# Patient Record
Sex: Male | Born: 1935 | Race: White | Hispanic: No | Marital: Married | Smoking: Never smoker
Health system: Southern US, Community
[De-identification: ages and names within clinical notes are randomized; demographics above are authoritative.]

## PROBLEM LIST (undated history)

## (undated) DIAGNOSIS — E039 Hypothyroidism, unspecified: Secondary | ICD-10-CM

## (undated) DIAGNOSIS — E119 Type 2 diabetes mellitus without complications: Secondary | ICD-10-CM

## (undated) DIAGNOSIS — I951 Orthostatic hypotension: Secondary | ICD-10-CM

## (undated) DIAGNOSIS — I480 Paroxysmal atrial fibrillation: Secondary | ICD-10-CM

## (undated) DIAGNOSIS — I639 Cerebral infarction, unspecified: Secondary | ICD-10-CM

## (undated) DIAGNOSIS — G20 Parkinson's disease: Secondary | ICD-10-CM

## (undated) HISTORY — PX: INGUINAL HERNIA REPAIR: SUR1180

## (undated) HISTORY — PX: COLONOSCOPY: SHX174

---

## 2016-12-18 ENCOUNTER — Emergency Department (HOSPITAL_COMMUNITY): Payer: Medicare Other

## 2016-12-18 ENCOUNTER — Inpatient Hospital Stay (HOSPITAL_COMMUNITY)
Admission: EM | Admit: 2016-12-18 | Discharge: 2016-12-24 | DRG: 064 | Disposition: A | Discharge status: Skilled Nursing Facility | Payer: Medicare Other | Attending: Internal Medicine | Admitting: Internal Medicine

## 2016-12-18 DIAGNOSIS — R402212 Coma scale, best verbal response, none, at arrival to emergency department: Secondary | ICD-10-CM | POA: Diagnosis present

## 2016-12-18 DIAGNOSIS — Z515 Encounter for palliative care: Secondary | ICD-10-CM

## 2016-12-18 DIAGNOSIS — R402112 Coma scale, eyes open, never, at arrival to emergency department: Secondary | ICD-10-CM | POA: Diagnosis present

## 2016-12-18 DIAGNOSIS — R4182 Altered mental status, unspecified: Secondary | ICD-10-CM | POA: Diagnosis not present

## 2016-12-18 DIAGNOSIS — E119 Type 2 diabetes mellitus without complications: Secondary | ICD-10-CM

## 2016-12-18 DIAGNOSIS — Z833 Family history of diabetes mellitus: Secondary | ICD-10-CM

## 2016-12-18 DIAGNOSIS — E876 Hypokalemia: Secondary | ICD-10-CM | POA: Diagnosis not present

## 2016-12-18 DIAGNOSIS — G20 Parkinson's disease: Secondary | ICD-10-CM | POA: Diagnosis present

## 2016-12-18 DIAGNOSIS — Z8673 Personal history of transient ischemic attack (TIA), and cerebral infarction without residual deficits: Secondary | ICD-10-CM

## 2016-12-18 DIAGNOSIS — I480 Paroxysmal atrial fibrillation: Secondary | ICD-10-CM | POA: Diagnosis present

## 2016-12-18 DIAGNOSIS — R54 Age-related physical debility: Secondary | ICD-10-CM | POA: Diagnosis present

## 2016-12-18 DIAGNOSIS — N183 Chronic kidney disease, stage 3 (moderate): Secondary | ICD-10-CM | POA: Diagnosis present

## 2016-12-18 DIAGNOSIS — I6340 Cerebral infarction due to embolism of unspecified cerebral artery: Secondary | ICD-10-CM | POA: Diagnosis not present

## 2016-12-18 DIAGNOSIS — I6322 Cerebral infarction due to unspecified occlusion or stenosis of basilar arteries: Secondary | ICD-10-CM

## 2016-12-18 DIAGNOSIS — E1122 Type 2 diabetes mellitus with diabetic chronic kidney disease: Secondary | ICD-10-CM | POA: Diagnosis present

## 2016-12-18 DIAGNOSIS — I639 Cerebral infarction, unspecified: Secondary | ICD-10-CM

## 2016-12-18 DIAGNOSIS — L8990 Pressure ulcer of unspecified site, unspecified stage: Secondary | ICD-10-CM | POA: Insufficient documentation

## 2016-12-18 DIAGNOSIS — Z66 Do not resuscitate: Secondary | ICD-10-CM | POA: Diagnosis present

## 2016-12-18 DIAGNOSIS — E039 Hypothyroidism, unspecified: Secondary | ICD-10-CM | POA: Diagnosis present

## 2016-12-18 DIAGNOSIS — I130 Hypertensive heart and chronic kidney disease with heart failure and stage 1 through stage 4 chronic kidney disease, or unspecified chronic kidney disease: Secondary | ICD-10-CM | POA: Diagnosis present

## 2016-12-18 DIAGNOSIS — I63219 Cerebral infarction due to unspecified occlusion or stenosis of unspecified vertebral arteries: Secondary | ICD-10-CM | POA: Diagnosis present

## 2016-12-18 DIAGNOSIS — R402342 Coma scale, best motor response, flexion withdrawal, at arrival to emergency department: Secondary | ICD-10-CM | POA: Diagnosis present

## 2016-12-18 DIAGNOSIS — Z7901 Long term (current) use of anticoagulants: Secondary | ICD-10-CM

## 2016-12-18 DIAGNOSIS — Z79899 Other long term (current) drug therapy: Secondary | ICD-10-CM

## 2016-12-18 DIAGNOSIS — I5032 Chronic diastolic (congestive) heart failure: Secondary | ICD-10-CM | POA: Diagnosis present

## 2016-12-18 DIAGNOSIS — F0280 Dementia in other diseases classified elsewhere without behavioral disturbance: Secondary | ICD-10-CM | POA: Diagnosis present

## 2016-12-18 DIAGNOSIS — R29713 NIHSS score 13: Secondary | ICD-10-CM | POA: Diagnosis present

## 2016-12-18 DIAGNOSIS — I6381 Other cerebral infarction due to occlusion or stenosis of small artery: Secondary | ICD-10-CM | POA: Diagnosis present

## 2016-12-18 HISTORY — DX: Type 2 diabetes mellitus without complications: E11.9

## 2016-12-18 HISTORY — DX: Hypothyroidism, unspecified: E03.9

## 2016-12-18 HISTORY — DX: Paroxysmal atrial fibrillation: I48.0

## 2016-12-18 HISTORY — DX: Orthostatic hypotension: I95.1

## 2016-12-18 HISTORY — DX: Cerebral infarction, unspecified: I63.9

## 2016-12-18 HISTORY — DX: Parkinson's disease: G20

## 2016-12-18 LAB — I-STAT TROPONIN, ED: Troponin i, poc: 0.01 ng/mL (ref 0.00–0.08)

## 2016-12-18 LAB — COMPREHENSIVE METABOLIC PANEL
ALBUMIN: 3.7 g/dL (ref 3.5–5.0)
ALT: 24 U/L (ref 17–63)
ANION GAP: 9 (ref 5–15)
AST: 20 U/L (ref 15–41)
Alkaline Phosphatase: 83 U/L (ref 38–126)
BILIRUBIN TOTAL: 0.7 mg/dL (ref 0.3–1.2)
BUN: 26 mg/dL — AB (ref 6–20)
CHLORIDE: 106 mmol/L (ref 101–111)
CO2: 28 mmol/L (ref 22–32)
Calcium: 9.1 mg/dL (ref 8.9–10.3)
Creatinine, Ser: 1.44 mg/dL — ABNORMAL HIGH (ref 0.61–1.24)
GFR calc Af Amer: 52 mL/min — ABNORMAL LOW (ref >60)
GFR calc non Af Amer: 45 mL/min — ABNORMAL LOW (ref >60)
GLUCOSE: 122 mg/dL — AB (ref 65–99)
POTASSIUM: 4 mmol/L (ref 3.5–5.1)
SODIUM: 143 mmol/L (ref 135–145)
TOTAL PROTEIN: 7.2 g/dL (ref 6.5–8.1)

## 2016-12-18 LAB — ETHANOL: Alcohol, Ethyl (B): <10 mg/dL (ref <10)

## 2016-12-18 LAB — CBC
HEMATOCRIT: 42.1 % (ref 39.0–52.0)
Hemoglobin: 14.6 g/dL (ref 13.0–17.0)
MCH: 35 pg — ABNORMAL HIGH (ref 26.0–34.0)
MCHC: 34.7 g/dL (ref 30.0–36.0)
MCV: 101 fL — AB (ref 78.0–100.0)
PLATELETS: 175 10*3/uL (ref 150–400)
RBC: 4.17 MIL/uL — AB (ref 4.22–5.81)
RDW: 13.1 % (ref 11.5–15.5)
WBC: 9.1 10*3/uL (ref 4.0–10.5)

## 2016-12-18 LAB — DIFFERENTIAL
BASOS ABS: 0 10*3/uL (ref 0.0–0.1)
Basophils Relative: 0 %
Eosinophils Absolute: 0 10*3/uL (ref 0.0–0.7)
Eosinophils Relative: 0 %
LYMPHS PCT: 25 %
Lymphs Abs: 2.2 10*3/uL (ref 0.7–4.0)
MONO ABS: 0.6 10*3/uL (ref 0.1–1.0)
Monocytes Relative: 7 %
NEUTROS ABS: 6.2 10*3/uL (ref 1.7–7.7)
Neutrophils Relative %: 68 %

## 2016-12-18 LAB — PROTIME-INR
INR: 2.17
PROTHROMBIN TIME: 24 s — AB (ref 11.4–15.2)

## 2016-12-18 LAB — APTT: APTT: 32 s (ref 24–36)

## 2016-12-18 MED ORDER — SODIUM CHLORIDE 0.9 % IV BOLUS (SEPSIS)
500.0000 mL | Freq: Once | INTRAVENOUS | Status: AC
  Administered 2016-12-18: 500 mL via INTRAVENOUS

## 2016-12-18 NOTE — ED Notes (Signed)
Patient transported to MRI 

## 2016-12-18 NOTE — ED Notes (Signed)
Attempted to straight stick patient for second set of blood cultures but was unsuccessful.

## 2016-12-18 NOTE — ED Notes (Signed)
Lab stated needed to draw another CMP. Notified RN.

## 2016-12-18 NOTE — ED Notes (Signed)
Pt to CT

## 2016-12-18 NOTE — ED Triage Notes (Signed)
Pt here via EMS with c/o AMS that began this morning at 630. Pt responds to touch and pain. Per family pt's mentation decreased across the day and is now not responsive.  Pt has previous stroke, and is confused at baseline, but is able to speak. Pt is currently nonverbal. Pt's CBG was 139 by EMS. Pt has hx of Parkinson's and dementia.

## 2016-12-18 NOTE — ED Notes (Signed)
Pt. Still at MRI. 

## 2016-12-18 NOTE — ED Provider Notes (Signed)
[facility]-EMERGENCY DEPT Provider Note   CSN: [id number] Arrival date & time: 12/18/16  1955     History   Chief Complaint No chief complaint on file.   HPI Andrew Ochoa is a 81 y.o. male presenting with AMS.   Level V caveat as pt is unresponsive.   Pt presenting with h/o parkinsons with dementia, CVA and TIA. Family states that beginning around 6 this morning, pt started to become more confused. He has been progressing until he was unresponsive. Per EMS, family states baseline pt will respond to basic questions. No known complaints prior to confusion.   Per chart review, pt has h/o CVA, unknown year. Per EMS, he ha sno residual neuro defects. He is on warfarin, no known falls. Neurologist at [facility].   HPI  No past medical history on file.  There are no active problems to display for this patient.   No past surgical history on file.     Home Medications    Prior to Admission medications   Medication Sig Start Date End Date Taking? Authorizing Provider  amiodarone (PACERONE) 100 MG tablet Take 100 mg by mouth daily. 07/21/16  Yes [provider]  carbidopa-levodopa (SINEMET IR) 25-100 MG tablet Take 1 tablet by mouth 3 (three) times daily.   Yes [provider]  Cholecalciferol (VITAMIN D3) 1000 units CAPS Take 1,000 Units by mouth daily.   Yes [provider]  Cinnamon 500 MG capsule Take 500 mg by mouth daily.   Yes [provider]  docusate sodium (COLACE) 100 MG capsule Take 100 mg by mouth daily.   Yes [provider]  ENSURE PLUS (ENSURE PLUS) LIQD Take 237 mLs by mouth 3 (three) times daily between meals.   Yes [provider]  fludrocortisone (FLORINEF) 0.1 MG tablet Take 0.1 mg by mouth daily.   Yes [provider]  levothyroxine (SYNTHROID, LEVOTHROID) 50 MCG tablet Take 50 mcg by mouth every morning. 12/11/16  Yes [provider]  lovastatin (MEVACOR) 20 MG tablet Take 20 mg by mouth  every evening. 06/21/16  Yes [provider]  magnesium oxide (MAG-OX) 400 MG tablet Take 400 mg by mouth daily.   Yes [provider]  Multiple Vitamin (MULTIVITAMIN) capsule Take 1 capsule by mouth every morning.   Yes [provider]  pantoprazole (PROTONIX) 40 MG tablet Take 40 mg by mouth daily. 07/04/16  Yes [provider]  risperiDONE (RISPERDAL) 0.5 MG tablet Take 0.5 mg by mouth daily. 11/24/16  Yes [provider]  warfarin (COUMADIN) 2 MG tablet Take 2 mg by mouth daily. 12/05/16  Yes [provider]    Family History No family history on file.  Social History Social History  Substance Use Topics  . Smoking status: Not on file  . Smokeless tobacco: Not on file  . Alcohol use Not on file     Allergies   Neomycin-bacitracin zn-polymyx and Benzalkonium chloride   Review of Systems Review of Systems  Unable to perform ROS: Mental status change     Physical Exam Updated Vital Signs BP 116/81 (BP Location: Right Arm)   Pulse 88   Temp 98.2 F (36.8 C) (Rectal)   Resp 16   SpO2 99%   Physical Exam  Constitutional:  Elderly male responds to painful stimulus. No response to sound. Will not open his eyes with painful stimulus.  HENT:  Head: Normocephalic and atraumatic.  Drooling, but maintaining airway without difficulty.   Eyes:  Pinpoint pupils.  Equal bilaterally.  Cardiovascular: Normal rate, regular rhythm and intact distal pulses.   Pulmonary/Chest: Effort normal. No respiratory distress. He has decreased breath sounds in the right lower field.  Abdominal: Soft. Bowel sounds are normal. There is no tenderness.  Musculoskeletal: He exhibits no edema.  No purposeful movement of any extremity.  Neurological: GCS eye subscore is 1. GCS verbal subscore is 1. GCS motor subscore is 4.  Skin: Skin is warm and dry.  Nursing note and vitals reviewed.   ED Treatments / Results  Labs (all labs ordered are listed,  but only abnormal results are displayed) Labs Reviewed  PROTIME-INR - Abnormal; Notable for the following:       Result Value   Prothrombin Time 24.0 (*)    All other components within normal limits  CBC - Abnormal; Notable for the following:    RBC 4.17 (*)    MCV 101.0 (*)    MCH 35.0 (*)    All other components within normal limits  COMPREHENSIVE METABOLIC PANEL - Abnormal; Notable for the following:    Glucose, Bld 122 (*)    BUN 26 (*)    Creatinine, Ser 1.44 (*)    GFR calc non Af Amer 45 (*)    GFR calc Af Amer 52 (*)    All other components within normal limits  I-STAT CHEM 8, ED - Abnormal; Notable for the following:    Potassium 6.3 (*)    BUN 38 (*)    Creatinine, Ser 1.50 (*)    Glucose, Bld 137 (*)    All other components within normal limits  ETHANOL  APTT  DIFFERENTIAL  RAPID URINE DRUG SCREEN, HOSP PERFORMED  URINALYSIS, ROUTINE W REFLEX MICROSCOPIC  COMPREHENSIVE METABOLIC PANEL  I-STAT TROPONIN, ED    EKG  EKG Interpretation  Date/Time:  Monday December 18 2016 20:14:59 EDT Ventricular Rate:  72 PR Interval:    QRS Duration: 104 QT Interval:  423 QTC Calculation: 463 R Axis:   -36 Text Interpretation:  Sinus rhythm Left axis deviation No old tracing to compare Confirmed by Dione Booze ([id number]) on 12/18/2016 8:28:14 PM       Radiology Dg Chest 2 View  Result Date: 12/18/2016 CLINICAL DATA:  Altered mental status. Nonresponsive. Abnormal lung sounds on exam. EXAM: CHEST  2 VIEW COMPARISON:  01/09/2016 FINDINGS: The heart size and mediastinal contours are within normal limits. Aortic atherosclerosis. Low lung volumes noted. Both lungs are clear. IMPRESSION: Low lung volumes.  No active disease. Electronically Signed   By: Myles Rosenthal M.D.   On: 12/18/2016 21:11   Ct Head Wo Contrast  Result Date: 12/18/2016 CLINICAL DATA:  Altered mental status EXAM: CT HEAD WITHOUT CONTRAST TECHNIQUE: Contiguous axial images were obtained from the base of the skull  through the vertex without intravenous contrast. COMPARISON:  06/01/2015 FINDINGS: Brain: Motion degraded study. No gross hemorrhage or mass is visualized. Hypodensity within the left thalamus, new since comparison CT from 2017. Additional small hypodensity in the right thalamus. Atrophy with small vessel ischemic changes of the white matter. Vascular: No hyperdense vessels.  Carotid artery calcification. Skull: No fracture or suspicious lesion. Fluid within the right mastoid air cells. Sinuses/Orbits: Mucosal thickening within the frontal and ethmoid sinuses. No acute orbital abnormality Other: None IMPRESSION: 1. Hypodensity within the left thalamus, new since 2017, and suspected to represent acute infarct. Could obtain MRI for confirmation. Smaller hypodensity in the right thalamus fell consistent with a age indeterminate lacunar infarct. No hemorrhage is  seen allowing for motion degradation 2. Atrophy with small vessel ischemic changes of the white matter 3. Right mastoid effusion Electronically Signed   By: Jasmine Pang M.D.   On: 12/18/2016 21:06   Mr Brain Wo Contrast  Result Date: 12/18/2016 CLINICAL DATA:  Altered mental status this morning.  Unresponsive. EXAM: MRI HEAD WITHOUT CONTRAST TECHNIQUE: Multiplanar, multiecho pulse sequences of the brain and surrounding structures were obtained without intravenous contrast. COMPARISON:  CT HEAD December 18, 2016 at 2044 hours FINDINGS: Due to habitus, patient scanned on side with old head coil resulting in suboptimal examination. Moderately motion degraded examination. BRAIN: Bilateral mesial thalamus reduced diffusion measuring to 12 mm on the LEFT, no ADC abnormality. No susceptibility artifact to suggest hemorrhage though motion degrades sensitivity. Ventricles and sulci are normal for patient's age. No midline shift, mass effect or definite masses. Patchy supratentorial white matter FLAIR T2 hyperintensities compatible with mild chronic small vessel  ischemic disease, less than expected for age. No abnormal extra-axial fluid collections. VASCULAR: Normal major intracranial vascular flow voids present at skull base. SKULL AND UPPER CERVICAL SPINE: No abnormal sellar expansion. No suspicious calvarial bone marrow signal. Craniocervical junction maintained. SINUSES/ORBITS: RIGHT mastoid effusion. Paranasal sinuses well aerated. The included ocular globes and orbital contents are non-suspicious. OTHER: None. IMPRESSION: 1. Habitus and motion degraded examination. 2. Subacute bilateral thalamus infarcts. Findings may reflect artery of percheron infarctions, basilar tip thrombosis, or dural venous sinus occlusion. Findings less likely reflect Wernicke's encephalopathy. 3. Given limited MRI examination, if vascular imaging is performed, recommend CT angiogram. Electronically Signed   By: Awilda Metro M.D.   On: 12/18/2016 22:41    Procedures .Critical Care Performed by: Alveria Apley Authorized by: Alveria Apley   Critical care provider statement:    Critical care time (minutes):  30   Critical care time was exclusive of:  Separately billable procedures and treating other patients and teaching time   Critical care was necessary to treat or prevent imminent or life-threatening deterioration of the following conditions:  CNS failure or compromise   Critical care was time spent personally by me on the following activities:  Development of treatment plan with patient or surrogate, discussions with consultants, evaluation of patient's response to treatment, examination of patient, obtaining history from patient or surrogate, review of old charts, re-evaluation of patient's condition, pulse oximetry, ordering and review of laboratory studies, ordering and review of radiographic studies and ordering and performing treatments and interventions   I assumed direction of critical care for this patient from another provider in my specialty: no      (including critical care time)  Medications Ordered in ED Medications  sodium chloride 0.9 % bolus 500 mL (500 mLs Intravenous New Bag/Given 12/18/16 2255)     Initial Impression / Assessment and Plan / ED Course  I have reviewed the triage vital signs and the nursing notes.  Pertinent labs & imaging results that were available during my care of the patient were reviewed by me and considered in my medical decision making (see chart for details).     Patient presenting with altered mental status. Per EMS, baseline for patient is ability to answer basic questions. Currently, he is a GCS of 8, and will respond to pain only. He will not open his eyes. He'll not respond to questions. Per facility, last seen normal at 6:00 this morning with increasing confusion throughout the day. History of CVA, TIA, A. Fib and Parkinson's dementia. Will order basic labs, troponin, PT/INR,  and UA.  Troponin negative. CBC reassuring, as patient is not anemic, no white count. I-STAT shows elevated potassium, likely hemolyzed. CMP shows potassium is normal at 4.0. Creatinine stable at 1.44. Otherwise reassuring. CT head shows possible acute left-sided infarct of the thalamus. Recommends MRI evaluation.  Patient returned from MRI. On reassessment, patient is slightly more responsive. Will not open his eye to stimuli, but mobile clenches eyes to prevent eyes and being open. Will jerk his hands in response to asking him to squeeze your hands. He is mumbling slightly/unintelligibly.   MRI shows bilateral subacute thalamus infarcts. Discussed with Dr. Amada Jupiter with neurology, and pt to be admitted to hosptialist service at [facility] for further management. Discussed findings with family, and they agree to plan.   Case discussed with Dr. Julian Reil, and pt to be admitted under hospitalist services.    Final Clinical Impressions(s) / ED Diagnoses   Final diagnoses:  Infarction of thalamus (HCC)  Altered mental status,  unspecified altered mental status type    New Prescriptions New Prescriptions   No medications on file     Alveria Apley, PA-C 12/18/16 2347    Dione Booze, MD 12/18/16 2355

## 2016-12-18 NOTE — ED Notes (Signed)
Lab called for recollect on light green tube for CMP, Lillibeth RN notified.

## 2016-12-19 ENCOUNTER — Inpatient Hospital Stay (HOSPITAL_COMMUNITY): Payer: Medicare Other

## 2016-12-19 ENCOUNTER — Encounter (HOSPITAL_COMMUNITY): Payer: Self-pay | Admitting: Internal Medicine

## 2016-12-19 DIAGNOSIS — I6381 Other cerebral infarction due to occlusion or stenosis of small artery: Secondary | ICD-10-CM | POA: Diagnosis present

## 2016-12-19 DIAGNOSIS — I63211 Cerebral infarction due to unspecified occlusion or stenosis of right vertebral arteries: Secondary | ICD-10-CM | POA: Diagnosis not present

## 2016-12-19 DIAGNOSIS — I130 Hypertensive heart and chronic kidney disease with heart failure and stage 1 through stage 4 chronic kidney disease, or unspecified chronic kidney disease: Secondary | ICD-10-CM | POA: Diagnosis present

## 2016-12-19 DIAGNOSIS — I6340 Cerebral infarction due to embolism of unspecified cerebral artery: Secondary | ICD-10-CM | POA: Diagnosis present

## 2016-12-19 DIAGNOSIS — I639 Cerebral infarction, unspecified: Secondary | ICD-10-CM | POA: Diagnosis not present

## 2016-12-19 DIAGNOSIS — F0280 Dementia in other diseases classified elsewhere without behavioral disturbance: Secondary | ICD-10-CM

## 2016-12-19 DIAGNOSIS — E119 Type 2 diabetes mellitus without complications: Secondary | ICD-10-CM | POA: Diagnosis not present

## 2016-12-19 DIAGNOSIS — I6322 Cerebral infarction due to unspecified occlusion or stenosis of basilar arteries: Secondary | ICD-10-CM

## 2016-12-19 DIAGNOSIS — I5032 Chronic diastolic (congestive) heart failure: Secondary | ICD-10-CM | POA: Diagnosis present

## 2016-12-19 DIAGNOSIS — R402342 Coma scale, best motor response, flexion withdrawal, at arrival to emergency department: Secondary | ICD-10-CM | POA: Diagnosis present

## 2016-12-19 DIAGNOSIS — R29713 NIHSS score 13: Secondary | ICD-10-CM | POA: Diagnosis present

## 2016-12-19 DIAGNOSIS — E039 Hypothyroidism, unspecified: Secondary | ICD-10-CM | POA: Diagnosis present

## 2016-12-19 DIAGNOSIS — I63219 Cerebral infarction due to unspecified occlusion or stenosis of unspecified vertebral arteries: Secondary | ICD-10-CM

## 2016-12-19 DIAGNOSIS — E1122 Type 2 diabetes mellitus with diabetic chronic kidney disease: Secondary | ICD-10-CM | POA: Diagnosis present

## 2016-12-19 DIAGNOSIS — Z833 Family history of diabetes mellitus: Secondary | ICD-10-CM | POA: Diagnosis not present

## 2016-12-19 DIAGNOSIS — L8990 Pressure ulcer of unspecified site, unspecified stage: Secondary | ICD-10-CM | POA: Insufficient documentation

## 2016-12-19 DIAGNOSIS — Z7901 Long term (current) use of anticoagulants: Secondary | ICD-10-CM | POA: Diagnosis not present

## 2016-12-19 DIAGNOSIS — R402212 Coma scale, best verbal response, none, at arrival to emergency department: Secondary | ICD-10-CM | POA: Diagnosis present

## 2016-12-19 DIAGNOSIS — R54 Age-related physical debility: Secondary | ICD-10-CM | POA: Diagnosis present

## 2016-12-19 DIAGNOSIS — Z79899 Other long term (current) drug therapy: Secondary | ICD-10-CM | POA: Diagnosis not present

## 2016-12-19 DIAGNOSIS — G20 Parkinson's disease: Secondary | ICD-10-CM

## 2016-12-19 DIAGNOSIS — N183 Chronic kidney disease, stage 3 (moderate): Secondary | ICD-10-CM | POA: Diagnosis present

## 2016-12-19 DIAGNOSIS — I5030 Unspecified diastolic (congestive) heart failure: Secondary | ICD-10-CM | POA: Diagnosis not present

## 2016-12-19 DIAGNOSIS — G20A1 Parkinson's disease without dyskinesia, without mention of fluctuations: Secondary | ICD-10-CM | POA: Diagnosis present

## 2016-12-19 DIAGNOSIS — E876 Hypokalemia: Secondary | ICD-10-CM | POA: Diagnosis not present

## 2016-12-19 DIAGNOSIS — Z66 Do not resuscitate: Secondary | ICD-10-CM | POA: Diagnosis present

## 2016-12-19 DIAGNOSIS — R402112 Coma scale, eyes open, never, at arrival to emergency department: Secondary | ICD-10-CM | POA: Diagnosis present

## 2016-12-19 DIAGNOSIS — R4182 Altered mental status, unspecified: Secondary | ICD-10-CM | POA: Diagnosis present

## 2016-12-19 DIAGNOSIS — Z515 Encounter for palliative care: Secondary | ICD-10-CM | POA: Diagnosis not present

## 2016-12-19 DIAGNOSIS — I480 Paroxysmal atrial fibrillation: Secondary | ICD-10-CM | POA: Diagnosis present

## 2016-12-19 DIAGNOSIS — Z8673 Personal history of transient ischemic attack (TIA), and cerebral infarction without residual deficits: Secondary | ICD-10-CM | POA: Diagnosis not present

## 2016-12-19 LAB — RAPID URINE DRUG SCREEN, HOSP PERFORMED
Amphetamines: NOT DETECTED
BARBITURATES: NOT DETECTED
Benzodiazepines: NOT DETECTED
Cocaine: NOT DETECTED
Opiates: NOT DETECTED
TETRAHYDROCANNABINOL: NOT DETECTED

## 2016-12-19 LAB — URINALYSIS, ROUTINE W REFLEX MICROSCOPIC
BILIRUBIN URINE: NEGATIVE
Glucose, UA: 50 mg/dL — AB
Hgb urine dipstick: NEGATIVE
Ketones, ur: NEGATIVE mg/dL
LEUKOCYTES UA: NEGATIVE
NITRITE: NEGATIVE
PH: 5 (ref 5.0–8.0)
Protein, ur: NEGATIVE mg/dL
SPECIFIC GRAVITY, URINE: 1.018 (ref 1.005–1.030)

## 2016-12-19 LAB — I-STAT CHEM 8, ED
BUN: 38 mg/dL — AB (ref 6–20)
CALCIUM ION: 1.16 mmol/L (ref 1.15–1.40)
CHLORIDE: 104 mmol/L (ref 101–111)
Creatinine, Ser: 1.5 mg/dL — ABNORMAL HIGH (ref 0.61–1.24)
GLUCOSE: 137 mg/dL — AB (ref 65–99)
HCT: 44 % (ref 39.0–52.0)
Hemoglobin: 15 g/dL (ref 13.0–17.0)
Potassium: 6.3 mmol/L (ref 3.5–5.1)
Sodium: 141 mmol/L (ref 135–145)
TCO2: 32 mmol/L (ref 22–32)

## 2016-12-19 LAB — PROTIME-INR
INR: 1.9
Prothrombin Time: 21.7 seconds — ABNORMAL HIGH (ref 11.4–15.2)

## 2016-12-19 LAB — MRSA PCR SCREENING: MRSA by PCR: NEGATIVE

## 2016-12-19 MED ORDER — LEVOTHYROXINE SODIUM 100 MCG IV SOLR
25.0000 ug | Freq: Every day | INTRAVENOUS | Status: DC
  Administered 2016-12-19 – 2016-12-24 (×6): 25 ug via INTRAVENOUS
  Filled 2016-12-19 (×7): qty 5

## 2016-12-19 MED ORDER — THIAMINE HCL 100 MG/ML IJ SOLN
100.0000 mg | Freq: Every day | INTRAMUSCULAR | Status: DC
  Administered 2016-12-19 – 2016-12-24 (×6): 100 mg via INTRAVENOUS
  Filled 2016-12-19 (×7): qty 2

## 2016-12-19 MED ORDER — STROKE: EARLY STAGES OF RECOVERY BOOK
Freq: Once | Status: AC
  Administered 2016-12-19: 01:00:00
  Filled 2016-12-19: qty 1

## 2016-12-19 MED ORDER — SODIUM CHLORIDE 0.9 % IV SOLN
INTRAVENOUS | Status: AC
  Administered 2016-12-19: 16:00:00 via INTRAVENOUS

## 2016-12-19 MED ORDER — ACETAMINOPHEN 650 MG RE SUPP
650.0000 mg | RECTAL | Status: DC | PRN
  Administered 2016-12-20 – 2016-12-21 (×3): 650 mg via RECTAL
  Filled 2016-12-19 (×3): qty 1

## 2016-12-19 MED ORDER — ACETAMINOPHEN 160 MG/5ML PO SOLN: 650.0000 mg | ORAL | Status: DC | PRN

## 2016-12-19 MED ORDER — ACETAMINOPHEN 325 MG PO TABS: 650.0000 mg | ORAL_TABLET | ORAL | Status: DC | PRN

## 2016-12-19 NOTE — Evaluation (Signed)
Physical Therapy Evaluation Patient Details Name: Andrew Ochoa MRN: [id number] DOB: 1935/09/25 Andrew Ochoa 12/19/2016   History of Present Illness  81 y.o.malewith a history of atrial fibrillation, stroke, dementia with Parkinson's disease who presents with decreased mental status. MRI showing subacute bilateral thalamus infarcts.  Clinical Impression  Pt admitted with above diagnosis. Pt currently with functional limitations due to the deficits listed below (see PT Problem List). At the time of PT eval pt lethargic and not opening eyes to command or noxious stimuli. Pt was sat EOB to attempt arousal, however level of participation did not change. Pt tolerated EOB for ~5 minutes, and during this time he followed 2 commands - "squeeze my hand" which he did bilaterally, and "wipe your face" where pt was given a washcloth in his L hand and min assist was provided for pt to reach his face. Pt will benefit from skilled PT to increase their independence and safety with mobility to allow discharge to the venue listed below.     Follow Up Recommendations SNF;Supervision/Assistance - 24 hour    Equipment Recommendations  None recommended by PT (TBD by next venue of care)    Recommendations for Other Services       Precautions / Restrictions Precautions Precautions: Fall Restrictions Weight Bearing Restrictions: No      Mobility  Bed Mobility Overal bed mobility: Needs Assistance Bed Mobility: Supine to Sit;Sit to Supine     Supine to sit: Total assist;+2 for physical assistance;HOB elevated Sit to supine: Total assist;+2 for physical assistance   General bed mobility comments: Total A +2 for bed mobility. Use of pad to transition hips from supine to EOB  Transfers                 General transfer comment: Defer due to poor arousal  Ambulation/Gait                Stairs            Wheelchair Mobility    Modified Rankin (Stroke Patients Only)        Balance Overall balance assessment: Needs assistance Sitting-balance support: No upper extremity supported;Feet supported Sitting balance-Leahy Scale: Zero Sitting balance - Comments: total A to maintain sitting balance. lateral lean to R and posterior Postural control: Posterior lean;Right lateral lean                                   Pertinent Vitals/Pain Pain Assessment: Faces Faces Pain Scale: No hurt (even with sternal rub) Pain Intervention(s): Monitored during session    Home Living Family/patient expects to be discharged to:: Skilled nursing facility                 Additional Comments: Per pt note, pt was at [facility]    Prior Function Level of Independence: Needs assistance   Gait / Transfers Assistance Needed: Assistance for all transfers  ADL's / Homemaking Assistance Needed: Pt able to perform grooming and self feeding  Comments: PLOF from chart review     Hand Dominance        Extremity/Trunk Assessment   Upper Extremity Assessment Upper Extremity Assessment: Defer to OT evaluation    Lower Extremity Assessment Lower Extremity Assessment: Generalized weakness;Difficult to assess due to impaired cognition    Cervical / Trunk Assessment Cervical / Trunk Assessment: Kyphotic;Other exceptions Cervical / Trunk Exceptions: Lateral flexion of neck to R  Communication  Communication: Other (comment) (Lethargy)  Cognition Arousal/Alertness: Lethargic Behavior During Therapy: Flat affect (Lethargic) Overall Cognitive Status: History of cognitive impairments - at baseline                                 General Comments: Pt very lethargic throughout session. Pt followed two commands (wash face and sqeeze hand).       General Comments      Exercises     Assessment/Plan    PT Assessment Patient needs continued PT services  PT Problem List Decreased strength;Decreased range of motion;Decreased activity  tolerance;Decreased balance;Decreased mobility;Decreased knowledge of use of DME;Decreased safety awareness;Decreased knowledge of precautions;Impaired tone       PT Treatment Interventions DME instruction;Stair training;Functional mobility training;Therapeutic activities;Therapeutic exercise;Neuromuscular re-education;Patient/family education;Cognitive remediation    PT Goals (Current goals can be found in the Care Plan section)  Acute Rehab PT Goals Patient Stated Goal: Not stated PT Goal Formulation: Patient unable to participate in goal setting Time For Goal Achievement: 01/02/17 Potential to Achieve Goals: Fair    Frequency Min 2X/week   Barriers to discharge        Co-evaluation PT/OT/SLP Co-Evaluation/Treatment: Yes Reason for Co-Treatment: Complexity of the patient's impairments (multi-system involvement);Necessary to address cognition/behavior during functional activity;For patient/therapist safety;To address functional/ADL transfers PT goals addressed during session: Mobility/safety with mobility;Balance OT goals addressed during session: ADL's and self-care       AM-PAC PT "6 Clicks" Daily Activity  Outcome Measure Difficulty turning over in bed (including adjusting bedclothes, sheets and blankets)?: Unable Difficulty moving from lying on back to sitting on the side of the bed? : Unable Difficulty sitting down on and standing up from a chair with arms (e.g., wheelchair, bedside commode, etc,.)?: Unable Help needed moving to and from a bed to chair (including a wheelchair)?: Total Help needed walking in hospital room?: Total Help needed climbing 3-5 steps with a railing? : Total 6 Click Score: 6    End of Session   Activity Tolerance: Other (comment) (Limited by lethargy) Patient left: in bed;with call bell/phone within reach;with bed alarm set;Other (comment) (Rolled blanket under R hip) Nurse Communication: Mobility status PT Visit Diagnosis: Other symptoms and  signs involving the nervous system (Z61.096)    Time: 0454-0981 PT Time Calculation (min) (ACUTE ONLY): 13 min   Charges:   PT Evaluation $PT Eval High Complexity: 1 High     PT G Codes:        Andrew Ochoa, PT, DPT Acute Rehabilitation Services Pager: [phone]   Andrew Ochoa 12/19/2016, 12:43 PM

## 2016-12-19 NOTE — Progress Notes (Signed)
Patient is awake this moment. When asked to wave, he waved his right hand, when asked to smile, patient smiled. Patient is whispering but not audible-ccnnot hear what he is saying. Family still at bedside

## 2016-12-19 NOTE — Progress Notes (Signed)
SLP Cancellation Note  Patient Details Name: Andrew Ochoa MRN: [id number] DOB: 06-23-1935   Cancelled treatment:       Reason Eval/Treat Not Completed: Fatigue/lethargy limiting ability to participate   Blenda Mounts Laurice 12/19/2016, 9:53 AM

## 2016-12-19 NOTE — Progress Notes (Signed)
STROKE TEAM PROGRESS NOTE   HISTORY OF PRESENT ILLNESS (per record) Andrew Ochoa is a 81 y.o. male with a history of atrial fibrillation, stroke, dementia with Parkinson's disease who presents with decreased mental status. He was last normal evening of 9/30. Today he has been difficult to arouse and therefore he was brought into the emergency department. In ER, he was found have bilateral thalamic strokes.  He is on Coumadin for his atrial fibrillation and apparently (per nursing) does not walk at baseline. According to his last neurology note from [facility], he has difficulty getting information from the patient and he has his head tilted to the right. He does not walk at all, but can feed himself 1 alert.  LKW: 9/30 Premorbid modified rankin scale: 4  Patient was not administered IV t-PA secondary to outside window.   SUBJECTIVE (INTERVAL HISTORY) No family at bedside this morning. He is sleeping and difficult to arouse limiting exam and history. He does not follow verbal commands or answer questions for [place]. He remains NPO due to diminished level of alertness and stroke.   OBJECTIVE Temp:  [97.9 F (36.6 C)-98.6 F (37 C)] 97.9 F (36.6 C) (10/02 1400) Pulse Rate:  [68-88] 77 (10/02 1400) Cardiac Rhythm: Normal sinus rhythm (10/02 0844) Resp:  [15-20] 20 (10/02 1400) BP: (97-170)/(61-104) 163/104 (10/02 1400) SpO2:  [94 %-100 %] 94 % (10/02 1400)  CBC:  Recent Labs Lab 12/18/16 2022 12/18/16 2044  WBC 9.1  --   NEUTROABS 6.2  --   HGB 14.6 15.0  HCT 42.1 44.0  MCV 101.0*  --   PLT 175  --     Basic Metabolic Panel:  Recent Labs Lab 12/18/16 2044 12/18/16 2236  NA 141 143  K 6.3* 4.0  CL 104 106  CO2  --  28  GLUCOSE 137* 122*  BUN 38* 26*  CREATININE 1.50* 1.44*  CALCIUM  --  9.1    Lipid Panel: No results found for: CHOL, TRIG, HDL, CHOLHDL, VLDL, LDLCALC HgbA1c: No results found for: HGBA1C Urine Drug Screen:    Component Value Date/Time   LABOPIA NONE DETECTED 12/19/2016 0906   COCAINSCRNUR NONE DETECTED 12/19/2016 0906   LABBENZ NONE DETECTED 12/19/2016 0906   AMPHETMU NONE DETECTED 12/19/2016 0906   THCU NONE DETECTED 12/19/2016 0906   LABBARB NONE DETECTED 12/19/2016 0906    Alcohol Level     Component Value Date/Time   ETH <10 12/18/2016 2236    IMAGING  Dg Chest 2 View 12/18/2016 IMPRESSION: Low lung volumes.  No active disease.  Ct Head Wo Contrast 12/18/2016 IMPRESSION: 1. Hypodensity within the left thalamus, new since 2017, and suspected to represent acute infarct. Could obtain MRI for confirmation. Smaller hypodensity in the right thalamus fell consistent with a age indeterminate lacunar infarct. No hemorrhage is seen allowing for motion degradation 2. Atrophy with small vessel ischemic changes of the white matter 3. Right mastoid effusion Electronically Signed   By: Jasmine Pang M.D.   On: 12/18/2016 21:06   Andrew Ochoa Head Wo Contrast 12/19/2016 IMPRESSION: 1. Limited motion degraded examination. No emergent large vessel occlusion. 2. Severe stenosis versus motion artifact proximal posterior cerebral artery's.  Andrew Brain Wo Contrast 12/18/2016 IMPRESSION: 1. Habitus and motion degraded examination. 2. Subacute bilateral thalamus infarcts. Findings may reflect artery of percheron infarctions, basilar tip thrombosis, or dural venous sinus occlusion. Findings less likely reflect Wernicke's encephalopathy. 3. Given limited MRI examination, if vascular imaging is performed, recommend CT angiogram.  PHYSICAL EXAM Constitutional: Frail malnourished looking Elderly man lying asleep in fixed position in bed with neck deviated to right Eyes: Not following command, reactive Neuro: Mental Status: Awakens to noxious stimuli, but he keeps his eyes tightly closed. He does not follow commands.speaks only in response to noxious stimuli CN: Eyes shut, reactive pupils, relative facial symmetry, neck fixed flexed towards right  shoulder Motor: moderate rigidity versus paratonia in bilateral upper extremities. He localizes with his right arm to stimuli. Legs withdraw to noxious stimuli Sensory: Difficult to assess changes Cerebellar: Not performing tasks Gait deferred  ASSESSMENT/PLAN Andrew. Oseph Ochoa is a 81 y.o. male with history of Afib, stroke, dementia, Parkinson's disease presenting with decreased level of consciousness. He did not receive IV t-PA due to outside window.   Stroke in bilateral thalamus presumably embolic secondary to afib while on therapeutic dose coumadin  Resultant  Decreased level of alertness  CT head left and smaller right thalamus hypodensity suggesting acute infarct  MRI head subacute bilateral thalamic infarctions  MRA head Severe stenosis vs motion artifact proximal PCAs  Carotid Doppler  pending  2D Echo  pending  LDL pending  HgbA1c pending  Lovenox for VTE prophylaxis  Diet NPO time specified  warfarin daily prior to admission, now on warfarin daily   Ongoing aggressive stroke risk factor management  Therapy recommendations:  SNF rehab  Disposition:  pending  Hypertension  Stable  Permissive hypertension (OK if < 220/120) but gradually normalize in 5-7 days  Long-term BP goal normotensive  Hyperlipidemia  Home meds:  Lovastatin , held so far for NPO  LDL pending, goal < 70  Continue statin at discharge  Diabetes  HgbA1c pending, goal < 7.0  Presume controlled  Other Stroke Risk Factors  Advanced age  Hx stroke/TIA  Other Active Problems  Parkinson's disease- He is currently NPO for both lethargy and inability to assess swallow function. This may not improve quickly considering location of infarct limiting level of consciousness. Probably needs at least temporary gastric tube access for critical medications- sinemet, warfarin, statin.  Recurrent stroke on therapeutic coumadin, we can consider transition to DOAC will discuss with  pharmacy  Hospital day # 0  I have personally examined this patient, reviewed notes, independently viewed imaging studies, participated in medical decision making and plan of care.ROS completed by me personally and pertinent positives fully documented  I have made any additions or clarifications directly to the above note. . I had a long discussion at the bedside with the patient's wife and daughter as well as his son Kathlene November whom I spoke to over the phone who   informed me that his baseline level of functioning is very poor. He is able to assist with transfers but is unable to walk. He is able to normally speak and have a conversation. The family agrees to DO NOT RESUSCITATE but would like to pursue medical care at least for a few days to see if he gets any better. I explained to them that the patient is made likely end up requiring a feeding tube which may end up being long-term. Family would like to give him at least one more day and have a swallow eval done tomorrow and then make a decision about an acute and medications. Repeat INR today and if it below 1.8 may need to switch to IV heparin till is able to swallow and start warfarin back Discussed with Dr. Cena Benton. Greater than 50% time during this 35 minute visit was spent on  counseling and coordination of care about his embolic stroke, baseline dementia parkinsonism and answering questions. Delia Heady, MD Medical Director [facility] Pager: [phone] 12/19/2016 4:23 PM    To contact Stroke Continuity provider, please refer to [email or web address]. After hours, contact General Neurology

## 2016-12-19 NOTE — Consult Note (Signed)
Neurology Consultation Reason for Consult: Stroke Referring Physician: Laban Emperor  CC: Decreased responsiveness  History is obtained from: Chart review  HPI: Andrew Ochoa is a 81 y.o. male with a history of atrial fibrillation, stroke, dementia with Parkinson's disease who presents with decreased mental status. He was last normal evening of 9/30. Today he has been difficult to arouse and therefore he was brought into the emergency department. In ER, he was found have bilateral thalamic strokes.  He is on Coumadin for his atrial fibrillation and apparently (per nursing) does not walk at baseline. According to his last neurology note from [facility], he has difficulty getting information from the patient and he has his head tilted to the right. He does not walk at all, but can feed himself 1 alert.  LKW: 9/30 Premorbid modified rankin scale: 4    ROS:  Unable to obtain due to altered mental status.   Past Medical History:  Diagnosis Date  . DM2 (diabetes mellitus, type 2) (HCC)   . Hypothyroidism   . Orthostatic hypotension   . PAF (paroxysmal atrial fibrillation) (HCC)   . Parkinson's disease (HCC)   . Stroke [facility])      Family History  Problem Relation Age of Onset  . Diabetes Father      Social History:  reports that he has never smoked. He does not have any smokeless tobacco history on file. He reports that he does not drink alcohol or use drugs.   Exam: Current vital signs: BP 121/82   Pulse 81   Temp 98.2 F (36.8 C) (Rectal)   Resp 16   SpO2 99%  Vital signs in last 24 hours: Temp:  [98.2 F (36.8 C)] 98.2 F (36.8 C) (10/01 2019) Pulse Rate:  [71-88] 81 (10/02 0130) Resp:  [15-20] 16 (10/01 2302) BP: (97-170)/(73-91) 121/82 (10/02 0130) SpO2:  [99 %] 99 % (10/01 2302)   Physical Exam  Constitutional: Appears Elderly Psych: Does not interact Eyes: No scleral injection HENT: No OP obstrucion Head: Normocephalic.  Cardiovascular: Normal rate and  regular rhythm.  Respiratory: Effort normal and breath sounds normal to anterior ascultation GI: Soft.  No distension. There is no tenderness.  Skin: WDI  Neuro: Mental Status: Patient awakens to noxious stimuli, but he keeps his eyes tightly closed. He does not follow commands. Cranial Nerves: II: He resists eye opening to the point that I am unable to test visual fields. Pupils are reactive bilaterally, I'm unable to test the same time due to degree to which he keeps his eyes closed III,IV, VI: His eyes are relatively midline and looking up(Bell's phenomenon) V:VII: He resists eye opening fairly well Motor: He has increased tone in bilateral upper extremities. He localizes with his right arm, does not move his left arm quite as much but does squeeze my fingers with it. The legs he withdraws bilaterally to noxious stimulus Sensory: He responds to noxious stimulus in all 4 extremities Cerebellar: He does not perform    I have reviewed labs in epic and the results pertinent to this consultation are: Mildly elevated creatinine  I have reviewed the images obtained: MRI brain-bilateral thalamic infarcts  Impression: 81 year old male with bilateral thalamic infarcts. There was no progressive disease to support  he Wernicke encephalopathy. I suspect he had a posterior circulation embolus, but given his poor baseline he is not an interventional candidate. I suspect his current mental status may be due to the bilateral nature of the thalamic infarcts. He is on Coumadin  and given the small size of infarcts, I think he is at relatively low risk of hemorrhage.   Of note, if he is not able to swallow tomorrow then an NG will need to be placed to administer his Sinemet.  Recommendations: 1. HgbA1c, fasting lipid panel 2. MRI, MRA  of the brain without contrast 3. Frequent neuro checks 4. Echocardiogram 5. Carotid dopplers 6. Prophylactic- anticoagulated 7. Risk factor modification 8.  Telemetry monitoring 9. PT consult, OT consult, Speech consult 10. please page stroke NP  Or  PA  Or MD  from 8am -4 pm as this patient will be followed by the stroke team at this point.   You can look them up on [email or web address]      Ritta Slot, MD [facility] [phone]  If 7pm- 7am, please page neurology on call as listed in AMION.

## 2016-12-19 NOTE — Progress Notes (Signed)
Patient is awake this moment-grand daughter is at bedside. Patient is asked to wave and he waved.  Asked patient "how are you"? Patient whispered "alright"

## 2016-12-19 NOTE — Progress Notes (Signed)
Patient's daughter reports that PT's coumadin was held at the [facility]illed [facility] Facility where he lives "either on Friday or Saturday". She states 'Im not really sure for how long but they said  his level was 3.1"  Patient is back to sleep now. Spouse and daughter still at bedside

## 2016-12-19 NOTE — Clinical Social Work Note (Signed)
Clinical Social Work Assessment  Patient Details  Name: Andrew Ochoa MRN: [id number] Date of Birth: 1936/03/01  Date of referral:  12/19/16               Reason for consult:  Facility Placement                Permission sought to share information with:  Facility Sport and exercise psychologist, Family Supports Permission granted to share information::  Yes, Verbal Permission Granted  Name::     Sharyn Blitz  Agency::  SNF  Relationship::  Family  Contact Information:     Housing/Transportation Living arrangements for the past 2 months:  [place] of Information:  Adult Children, Spouse Patient Interpreter Needed:  None Criminal Activity/Legal Involvement Pertinent to Current Situation/Hospitalization:  No - Comment as needed Significant Relationships:  Adult Children, Spouse Lives with:  Self, Facility Resident Do you feel safe going back to the place where you live?  Yes Need for family participation in patient care:  Yes (Comment) (patient minimally responsive)  Care giving concerns:  Patient has been living at [facility] since February, and family is satisfied with care received.   Social Worker assessment / plan:  CSW met with patient and family at bedside to begin discharge planning. CSW discussed prior living situation with patient's family. CSW explained that medical team will continue to follow to determine recommendations as patient is assessed, and will facilitate discharge back to SNF if appropriate at discharge.  Employment status:  Retired Nurse, adult PT Recommendations:  [place] / Referral to community resources:     Patient/Family's Response to care:  Patient's response unable to be assessed due to minimal responses. Patient's family is agreeable to return to SNF if patient improves to be able to return.  Patient/Family's Understanding of and Emotional Response to Diagnosis,  Current Treatment, and Prognosis:  Patient's response unable to be assessed at this time. Patient's family was happy at the time of assessment because patient had first opened his eyes and was starting to respond. Patient's family is hopeful that the patient will continue to recover to be able to return to SNF, but they are realistic that he will continue to deteriorate at some rate due to his Parkinson's. Patient's family appreciative of CSW assistance.  Emotional Assessment Appearance:  Appears stated age Attitude/Demeanor/Rapport:  Unable to Assess Affect (typically observed):  Unable to Assess Orientation:   (not oriented) Alcohol / Substance use:  Not Applicable Psych involvement (Current and /or in the community):  No (Comment)  Discharge Needs  Concerns to be addressed:  Care Coordination Readmission within the last 30 days:  No Current discharge risk:  Physical Impairment, Cognitively Impaired Barriers to Discharge:  Continued Medical Work up   [employer], [place] 12/19/2016, 4:35 PM

## 2016-12-19 NOTE — Evaluation (Signed)
Occupational Therapy Evaluation Patient Details Name: Andrew Ochoa MRN: [id number] DOB: 1935/04/12 Today's Date: 12/19/2016    History of Present Illness 81 y.o.malewith a history of atrial fibrillation, stroke, dementia with Parkinson's disease who presents with decreased mental status. MRI showing subacute bilateral thalamus infarcts.   Clinical Impression   PTA, pt from SNF and was able to perform grooming and self feeding; information gathered from chart review. Upon arrival, pt supine in bed with lateral flexion of trunk and neck to R. Pt presenting with significant lethargy impact pt participation and functional performance. Pt currently requiring total A for ADLs and bed mobility. Pt initiating motor movement to wash face while seated EOB and required Min A to bring hand to face; total A to maintain sitting EOB. Recommend dc SNF for further OT to increase occupational performance and participation. Will continue to follow acutely to facilitate safe dc.    Follow Up Recommendations  SNF;Supervision/Assistance - 24 hour    Equipment Recommendations  Other (comment) (Defer to next venue)    Recommendations for Other Services PT consult     Precautions / Restrictions Precautions Precautions: Fall Restrictions Weight Bearing Restrictions: No      Mobility Bed Mobility Overal bed mobility: Needs Assistance Bed Mobility: Supine to Sit;Sit to Supine     Supine to sit: Total assist;+2 for physical assistance;HOB elevated Sit to supine: Total assist;+2 for physical assistance   General bed mobility comments: Total A +2 for bed mobility. Use of pad to transition hips from supine to EOB  Transfers                 General transfer comment: Defer due to poor arousal    Balance Overall balance assessment: Needs assistance Sitting-balance support: No upper extremity supported;Feet supported Sitting balance-Leahy Scale: Zero Sitting balance - Comments: total A to  maintain sitting balance. lateral lean to R and posterior Postural control: Posterior lean;Right lateral lean                                 ADL either performed or assessed with clinical judgement   ADL Overall ADL's : Needs assistance/impaired                                       General ADL Comments: Pt requring total A for ADLs and bed mobility     Vision         Perception     Praxis      Pertinent Vitals/Pain Pain Assessment: Faces Faces Pain Scale: No hurt Pain Intervention(s): Monitored during session     Hand Dominance     Extremity/Trunk Assessment Upper Extremity Assessment Upper Extremity Assessment: Difficult to assess due to impaired cognition   Lower Extremity Assessment Lower Extremity Assessment: Defer to PT evaluation   Cervical / Trunk Assessment Cervical / Trunk Assessment: Kyphotic;Other exceptions Cervical / Trunk Exceptions: Lateral flexion of neck to R   Communication Communication Communication: Other (comment) (Lethargy)   Cognition Arousal/Alertness: Lethargic Behavior During Therapy: Flat affect (Lethargic) Overall Cognitive Status: History of cognitive impairments - at baseline                                 General Comments: Pt very lethargic throughout session. Pt follwo two commands (wash  face and sqeeze hand).    General Comments       Exercises     Shoulder Instructions      Home Living Family/patient expects to be discharged to:: Skilled nursing facility                                 Additional Comments: Per pt note, pt was at SNF      Prior Functioning/Environment Level of Independence: Needs assistance  Gait / Transfers Assistance Needed: Assistance for all transfers ADL's / Homemaking Assistance Needed: Pt able to perform grooming and self feeding   Comments: PLOF from chart review        OT Problem List: Decreased range of motion;Decreased  activity tolerance;Impaired balance (sitting and/or standing);Decreased cognition;Decreased safety awareness;Impaired UE functional use      OT Treatment/Interventions: Self-care/ADL training;Therapeutic exercise;Energy conservation;DME and/or AE instruction;Therapeutic activities;Patient/family education    OT Goals(Current goals can be found in the care plan section) Acute Rehab OT Goals Patient Stated Goal: Not stated OT Goal Formulation: With patient Time For Goal Achievement: 01/02/17 Potential to Achieve Goals: Good ADL Goals Pt Will Perform Eating: with min assist;sitting;bed level (supported sitting) Pt Will Perform Grooming: with min assist;sitting;bed level (supported sitting) Additional ADL Goal #1: Pt will perform bed mobility with Max A +2 in preparation for ADLs  OT Frequency: Min 2X/week   Barriers to D/C:            Co-evaluation              AM-PAC PT "6 Clicks" Daily Activity     Outcome Measure Help from another person eating meals?: Total Help from another person taking care of personal grooming?: Total Help from another person toileting, which includes using toliet, bedpan, or urinal?: Total Help from another person bathing (including washing, rinsing, drying)?: Total Help from another person to put on and taking off regular upper body clothing?: Total Help from another person to put on and taking off regular lower body clothing?: Total 6 Click Score: 6   End of Session Equipment Utilized During Treatment: Oxygen Nurse Communication: Mobility status;Other (comment) (Need for air mattress)  Activity Tolerance: Patient limited by lethargy Patient left: in bed;with bed alarm set;with call bell/phone within reach  OT Visit Diagnosis: Other symptoms and signs involving cognitive function;Muscle weakness (generalized) (M62.81)                Time: 6073-7106 OT Time Calculation (min): 14 min Charges:  OT General Charges $OT Visit: 1 Visit OT  Evaluation $OT Eval Moderate Complexity: 1 Mod G-Codes:     Graycie Halley MSOT, OTR/L Acute Rehab Pager: [phone] Office: [phone]  Theodoro Grist Raelle Chambers 12/19/2016, 11:48 AM

## 2016-12-19 NOTE — Progress Notes (Signed)
ANTICOAGULATION CONSULT NOTE - Initial Consult  Pharmacy Consult for warfarin Indication: atrial fibrillation  Allergies  Allergen Reactions  . Neomycin-Bacitracin Zn-Polymyx Hives  . Benzalkonium Chloride Rash    Patient Measurements: Height: 5'11" Weight: 183 lbs   Vital Signs: Temp: 98.7 F (37.1 C) (10/02 1711) Temp Source: Oral (10/02 1711) BP: 145/91 (10/02 1711) Pulse Rate: 69 (10/02 1711)  Labs:  Recent Labs  12/18/16 2022 12/18/16 2044 12/18/16 2236 12/19/16 1626  HGB 14.6 15.0  --   --   HCT 42.1 44.0  --   --   PLT 175  --   --   --   APTT 32  --   --   --   LABPROT 24.0*  --   --  21.7*  INR 2.17  --   --  1.90  CREATININE  --  1.50* 1.44*  --     Medical History: Past Medical History:  Diagnosis Date  . DM2 (diabetes mellitus, type 2) (HCC)   . Hypothyroidism   . Orthostatic hypotension   . PAF (paroxysmal atrial fibrillation) (HCC)   . Parkinson's disease (HCC)   . Stroke [facility])     Medications:  Scheduled:  . levothyroxine  25 mcg Intravenous Daily  . thiamine injection  100 mg Intravenous Daily    Assessment: 86 yom with history of dementia with Parkinson's disease who was found to have a bilateral thalamic stroke. He has a history of atrial fibrillation, on 2 mg warfarin prior to admission. Last dose was on 9/30. He is currently in normal sinus rhythm. INR on admission was 2.17. Today is 1.9. Pharmacy consulted to manage warfarin therapy and per stroke team to start heparin if INR<1.8. He has been difficult to arouse today and speech was unable to assess patient. Currently is not receiving any medications by mouth.   Goal of Therapy:  INR 2-3 Monitor platelets by anticoagulation protocol: Yes   Plan:  Hold on warfarin dosing tonight Follow INR with morning labs and assess for need for heparin infusion Will monitor CBC, INR, and s/sx of bleeding daily  Girard Cooter, PharmD Clinical Pharmacist  Phone: [phone] 12/19/2016,5:35  PM

## 2016-12-19 NOTE — H&P (Addendum)
History and Physical    Andrew Ochoa KVQ:[id number] DOB: 1935/10/16 DOA: 12/18/2016  PCP: System, Provider Not In  Patient coming from: SNF  I have personally briefly reviewed patient's old medical records in [facility]  Chief Complaint: AMS  HPI: Andrew Ochoa is a 81 y.o. male with medical history significant of Parkinson's disease and dementia, prior stroke, orthostatic hypotension.  Patient sent in to ED by SNF after developing reportedly progressive lethargy and AMS over course of day.  LKW was last evening.  Got to point where he wouldn't wake up with sternal rub.   ED Course: MRI demonstrates B subacute thalamic strokes L>R   Review of Systems: Unable to perform, GCS is 6.  Past Medical History:  Diagnosis Date  . DM2 (diabetes mellitus, type 2) (HCC)   . Hypothyroidism   . Orthostatic hypotension   . PAF (paroxysmal atrial fibrillation) (HCC)   . Parkinson's disease (HCC)   . Stroke [facility])     Past Surgical History:  Procedure Laterality Date  . COLONOSCOPY    . INGUINAL HERNIA REPAIR       reports that he has never smoked. He does not have any smokeless tobacco history on file. He reports that he does not drink alcohol or use drugs.  Allergies  Allergen Reactions  . Neomycin-Bacitracin Zn-Polymyx Hives  . Benzalkonium Chloride Rash    Family History  Problem Relation Age of Onset  . Diabetes Father      Prior to Admission medications   Medication Sig Start Date End Date Taking? Authorizing Provider  amiodarone (PACERONE) 100 MG tablet Take 100 mg by mouth daily. 07/21/16  Yes [provider]  carbidopa-levodopa (SINEMET IR) 25-100 MG tablet Take 1 tablet by mouth 3 (three) times daily.   Yes [provider]  Cholecalciferol (VITAMIN D3) 1000 units CAPS Take 1,000 Units by mouth daily.   Yes [provider]  Cinnamon 500 MG capsule Take 500 mg by mouth daily.   Yes [provider]  docusate sodium (COLACE)  100 MG capsule Take 100 mg by mouth daily.   Yes [provider]  ENSURE PLUS (ENSURE PLUS) LIQD Take 237 mLs by mouth 3 (three) times daily between meals.   Yes [provider]  fludrocortisone (FLORINEF) 0.1 MG tablet Take 0.1 mg by mouth daily.   Yes [provider]  levothyroxine (SYNTHROID, LEVOTHROID) 50 MCG tablet Take 50 mcg by mouth every morning. 12/11/16  Yes [provider]  lovastatin (MEVACOR) 20 MG tablet Take 20 mg by mouth every evening. 06/21/16  Yes [provider]  magnesium oxide (MAG-OX) 400 MG tablet Take 400 mg by mouth daily.   Yes [provider]  Multiple Vitamin (MULTIVITAMIN) capsule Take 1 capsule by mouth every morning.   Yes [provider]  pantoprazole (PROTONIX) 40 MG tablet Take 40 mg by mouth daily. 07/04/16  Yes [provider]  risperiDONE (RISPERDAL) 0.5 MG tablet Take 0.5 mg by mouth daily. 11/24/16  Yes [provider]  warfarin (COUMADIN) 2 MG tablet Take 2 mg by mouth daily. 12/05/16  Yes [provider]    Physical Exam: Vitals:   12/18/16 2115 12/18/16 2130 12/18/16 2300 12/18/16 2302  BP:   116/81 116/81  Pulse: 86 85 77 88  Resp: Temp:      TempSrc:      SpO2:    99%    Constitutional: NAD, Unarrousable  Eyes: Pinpoint bilaterally ENMT: Mucous  membranes are moist. Posterior pharynx clear of any exudate or lesions.Normal dentition.  Neck: normal, supple, no masses, no thyromegaly Respiratory: clear to auscultation bilaterally, no wheezing, no crackles. Normal respiratory effort. No accessory muscle use.  Cardiovascular: Regular rate and rhythm, no murmurs / rubs / gallops. No extremity edema. 2+ pedal pulses. No carotid bruits.  Abdomen: no tenderness, no masses palpated. No hepatosplenomegaly. Bowel sounds positive.  Musculoskeletal: no clubbing / cyanosis. No joint deformity upper and lower extremities. Good ROM, no contractures. Normal muscle  tone.  Skin: no rashes, lesions, ulcers. No induration Neurologic: GCS is 6 Psychiatric: GCS of 6    Labs on Admission: I have personally reviewed following labs and imaging studies  CBC:  Recent Labs Lab 12/18/16 2022 12/18/16 2044  WBC 9.1  --   NEUTROABS 6.2  --   HGB 14.6 15.0  HCT 42.1 44.0  MCV 101.0*  --   PLT 175  --    Basic Metabolic Panel:  Recent Labs Lab 12/18/16 2044 12/18/16 2236  NA 141 143  K 6.3* 4.0  CL 104 106  CO2  --  28  GLUCOSE 137* 122*  BUN 38* 26*  CREATININE 1.50* 1.44*  CALCIUM  --  9.1   GFR: CrCl cannot be calculated (Unknown ideal weight.). Liver Function Tests:  Recent Labs Lab 12/18/16 2236  AST 20  ALT 24  ALKPHOS 83  BILITOT 0.7  PROT 7.2  ALBUMIN 3.7   No results for input(s): LIPASE, AMYLASE in the last 168 hours. No results for input(s): AMMONIA in the last 168 hours. Coagulation Profile:  Recent Labs Lab 12/18/16 2022  INR 2.17   Cardiac Enzymes: No results for input(s): CKTOTAL, CKMB, CKMBINDEX, TROPONINI in the last 168 hours. BNP (last 3 results) No results for input(s): PROBNP in the last 8760 hours. HbA1C: No results for input(s): HGBA1C in the last 72 hours. CBG: No results for input(s): GLUCAP in the last 168 hours. Lipid Profile: No results for input(s): CHOL, HDL, LDLCALC, TRIG, CHOLHDL, LDLDIRECT in the last 72 hours. Thyroid Function Tests: No results for input(s): TSH, T4TOTAL, FREET4, T3FREE, THYROIDAB in the last 72 hours. Anemia Panel: No results for input(s): VITAMINB12, FOLATE, FERRITIN, TIBC, IRON, RETICCTPCT in the last 72 hours. Urine analysis: No results found for: COLORURINE, APPEARANCEUR, LABSPEC, PHURINE, GLUCOSEU, HGBUR, BILIRUBINUR, KETONESUR, PROTEINUR, UROBILINOGEN, NITRITE, LEUKOCYTESUR  Radiological Exams on Admission: Dg Chest 2 View  Result Date: 12/18/2016 CLINICAL DATA:  Altered mental status. Nonresponsive. Abnormal lung sounds on exam. EXAM: CHEST  2 VIEW  COMPARISON:  01/09/2016 FINDINGS: The heart size and mediastinal contours are within normal limits. Aortic atherosclerosis. Low lung volumes noted. Both lungs are clear. IMPRESSION: Low lung volumes.  No active disease. Electronically Signed   By: Myles Rosenthal M.D.   On: 12/18/2016 21:11   Ct Head Wo Contrast  Result Date: 12/18/2016 CLINICAL DATA:  Altered mental status EXAM: CT HEAD WITHOUT CONTRAST TECHNIQUE: Contiguous axial images were obtained from the base of the skull through the vertex without intravenous contrast. COMPARISON:  06/01/2015 FINDINGS: Brain: Motion degraded study. No gross hemorrhage or mass is visualized. Hypodensity within the left thalamus, new since comparison CT from 2017. Additional small hypodensity in the right thalamus. Atrophy with small vessel ischemic changes of the white matter. Vascular: No hyperdense vessels.  Carotid artery calcification. Skull: No fracture or suspicious lesion. Fluid within the right mastoid air cells. Sinuses/Orbits: Mucosal thickening within the frontal and ethmoid sinuses. No acute orbital abnormality Other: None  IMPRESSION: 1. Hypodensity within the left thalamus, new since 2017, and suspected to represent acute infarct. Could obtain MRI for confirmation. Smaller hypodensity in the right thalamus fell consistent with a age indeterminate lacunar infarct. No hemorrhage is seen allowing for motion degradation 2. Atrophy with small vessel ischemic changes of the white matter 3. Right mastoid effusion Electronically Signed   By: Jasmine Pang M.D.   On: 12/18/2016 21:06   Mr Brain Wo Contrast  Result Date: 12/18/2016 CLINICAL DATA:  Altered mental status this morning.  Unresponsive. EXAM: MRI HEAD WITHOUT CONTRAST TECHNIQUE: Multiplanar, multiecho pulse sequences of the brain and surrounding structures were obtained without intravenous contrast. COMPARISON:  CT HEAD December 18, 2016 at 2044 hours FINDINGS: Due to habitus, patient scanned on side with old  head coil resulting in suboptimal examination. Moderately motion degraded examination. BRAIN: Bilateral mesial thalamus reduced diffusion measuring to 12 mm on the LEFT, no ADC abnormality. No susceptibility artifact to suggest hemorrhage though motion degrades sensitivity. Ventricles and sulci are normal for patient's age. No midline shift, mass effect or definite masses. Patchy supratentorial white matter FLAIR T2 hyperintensities compatible with mild chronic small vessel ischemic disease, less than expected for age. No abnormal extra-axial fluid collections. VASCULAR: Normal major intracranial vascular flow voids present at skull base. SKULL AND UPPER CERVICAL SPINE: No abnormal sellar expansion. No suspicious calvarial bone marrow signal. Craniocervical junction maintained. SINUSES/ORBITS: RIGHT mastoid effusion. Paranasal sinuses well aerated. The included ocular globes and orbital contents are non-suspicious. OTHER: None. IMPRESSION: 1. Habitus and motion degraded examination. 2. Subacute bilateral thalamus infarcts. Findings may reflect artery of percheron infarctions, basilar tip thrombosis, or dural venous sinus occlusion. Findings less likely reflect Wernicke's encephalopathy. 3. Given limited MRI examination, if vascular imaging is performed, recommend CT angiogram. Electronically Signed   By: Awilda Metro M.D.   On: 12/18/2016 22:41    EKG: Independently reviewed.  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 Acute ischemic bilateral thalamic strokes, L>R - GCS 6 currently 1. Stroke pathway 2. Admit to [facility] 3. Per Dr. Amada Jupiter not candidate for intervention 4. Hope is that he may wake up because some of  R thalamus is intact 5. Prognosis is guarded at best however 6. NPO due to not being awake 2. Hypothyroidism - Synthroid IV daily 3. Parkinson's - 1. Holding PO sinemet 2. If this has to be held for prolonged period then will need to decide on NGT per Dr. Amada Jupiter, but OK to hold for tonight 4. PAF - 1. INR 2.1 2. Defer decision on starting heparin in acute stroke to stroke team when INR becomes theraputic 3. Will order repeat INR for tomorrow AM. 4. Holding PO amiodarone, but due to long half-life I dont anticipate that this will be an issue 5. DM2 - appears to be diet controlled at baseline  DVT prophylaxis: Theraputic INR Code Status: DNR per family Family Communication: Family at bedsice Disposition Plan: TBD Consults called: Spoke with Dr. Amada Jupiter Admission status: Admit to inpatient   Hillary Bow DO [facility] Pager [phone]  If 7AM-7PM, please contact day team taking care of patient [email or web address] Password [email or web address]  12/19/2016, 12:42 AM

## 2016-12-19 NOTE — Progress Notes (Signed)
Patient seen and evaluated earlier this am by my associate. Please refer to H and P for details regarding assessment and plan.   Currently undergoing stroke work up by neurology team.  Gen: pt resting comfortably CV: no cyanosis Pulm: no increased wob, no wheezes  Spoke with daughter who wanted an update from the neurology team  Will reassess next am  Cheryll Keisler, [place]aphne[place]ala Hurry

## 2016-12-19 NOTE — Progress Notes (Signed)
Arrived from [facility] at 3am. Disoriented. Eyes open. Whisper incomprehensible words. Follow some commands.

## 2016-12-19 NOTE — Progress Notes (Signed)
Patient is very sleepy this morning, he slept through his morning care(bathing and peri care). Patient responds to pain. No sign of discomfort observed at this time. Will continue to monitor

## 2016-12-19 NOTE — Progress Notes (Signed)
MD-Neurology is at bedside with family in the room

## 2016-12-19 NOTE — H&P (Deleted)
Patient is awake this moment-grand daughter is at bedside. Patient is asked to wave and he waved.  Asked patient "how are you"? Patient whispered "alright" Will continue to monitor

## 2016-12-19 NOTE — Progress Notes (Signed)
Patient's family are at patient's bedside-- 12:28. They are requesting to speak with MD about patient. Paged MD (Neuro and [facility])  [facility] MD called and stated to Neuro MD for patient.  Awaiting response

## 2016-12-20 ENCOUNTER — Inpatient Hospital Stay (HOSPITAL_COMMUNITY): Payer: Medicare Other

## 2016-12-20 DIAGNOSIS — I639 Cerebral infarction, unspecified: Secondary | ICD-10-CM

## 2016-12-20 DIAGNOSIS — I5030 Unspecified diastolic (congestive) heart failure: Secondary | ICD-10-CM

## 2016-12-20 DIAGNOSIS — I63211 Cerebral infarction due to unspecified occlusion or stenosis of right vertebral arteries: Secondary | ICD-10-CM

## 2016-12-20 LAB — LIPID PANEL
CHOL/HDL RATIO: 3.9 ratio
CHOLESTEROL: 151 mg/dL (ref 0–200)
HDL: 39 mg/dL — ABNORMAL LOW (ref >40)
LDL Cholesterol: 94 mg/dL (ref 0–99)
TRIGLYCERIDES: 92 mg/dL (ref <150)
VLDL: 18 mg/dL (ref 0–40)

## 2016-12-20 LAB — CBC
HEMATOCRIT: 37 % — AB (ref 39.0–52.0)
Hemoglobin: 12 g/dL — ABNORMAL LOW (ref 13.0–17.0)
MCH: 33.3 pg (ref 26.0–34.0)
MCHC: 32.4 g/dL (ref 30.0–36.0)
MCV: 102.8 fL — AB (ref 78.0–100.0)
Platelets: 195 10*3/uL (ref 150–400)
RBC: 3.6 MIL/uL — ABNORMAL LOW (ref 4.22–5.81)
RDW: 13.2 % (ref 11.5–15.5)
WBC: 8.7 10*3/uL (ref 4.0–10.5)

## 2016-12-20 LAB — HEMOGLOBIN A1C
Hgb A1c MFr Bld: 5.8 % — ABNORMAL HIGH (ref 4.8–5.6)
MEAN PLASMA GLUCOSE: 119.76 mg/dL

## 2016-12-20 LAB — PROTIME-INR
INR: 1.84
Prothrombin Time: 21.1 seconds — ABNORMAL HIGH (ref 11.4–15.2)

## 2016-12-20 LAB — HEPARIN LEVEL (UNFRACTIONATED): Heparin Unfractionated: 0.59 IU/mL (ref 0.30–0.70)

## 2016-12-20 MED ORDER — HEPARIN (PORCINE) IN NACL 100-0.45 UNIT/ML-% IJ SOLN
1100.0000 [IU]/h | INTRAMUSCULAR | Status: DC
  Administered 2016-12-20: 1100 [IU]/h via INTRAVENOUS
  Filled 2016-12-20: qty 250

## 2016-12-20 NOTE — Progress Notes (Signed)
PROGRESS NOTE    Andrew Ochoa  XBJ:[id number] DOB: [birth year] DOA: 12/18/2016 PCP: System, Provider Not In   No chief complaint on file.   Brief Narrative:  HPI on 12/19/2016 by Dr. Lyda Perone Andrew Ochoa is a 81 y.o. male with medical history significant of Parkinson's disease and dementia, prior stroke, orthostatic hypotension.  Patient sent in to ED by SNF after developing reportedly progressive lethargy and AMS over course of day.  LKW was last evening.  Got to point where he wouldn't wake up with sternal rub.  Assessment & Plan   Acute ischemic bilateral thalamic infarcts -L>R -Patient with decreased level of alertness -CT head showed left and smaller right thalamus hypodensity suggesting acute infarct -MRI brain showed subacute bilateral thalamic infarctions -MRA head severe stenosis for some motion artifact proximal PCA -Carotid Doppler pending  -Echocardiogram pending  -LDL and hemoglobin A1c pending  -Continue Coumadin once able to swallow, currently on IV heparin  -Patient on lovastatin 20 mg at home, currently held due to NPO status  Essential hypertension -Allowing for permissive hypertension  Hypothyroidism -Continue IV Synthroid  Parkinson's disease -Sinemet held  Paroxysmal atrial fibrillation -INR currently 1.84, will start heparin per pharmacy  Diabetes mellitus, type II -Appears to be diet controlled at baseline -hemoglobin A1c pending   Chronic kidney disease, stage III -Creatinine currently 1.44, no prior labs for comparison   DVT Prophylaxis  heparin  Code Status: DO NOT RESUSCITATE  Family Communication: None at bedside  Disposition Plan: Admitted  Consultants Neurology  Procedures  Echocardiogram  Antibiotics   Anti-infectives    None      Subjective:   Andrew Ochoa seen and examined today.  Currently nonverbal, very difficult to arouse.  Objective:   Vitals:   12/20/16 0031 12/20/16 0515 12/20/16 1018  12/20/16 1418  BP: 135/71 127/78 (!) 149/80 (!) 158/78  Pulse: 77 87 89 88  Resp: Temp: 99.5 F (37.5 C) 98.6 F (37 C) 98 F (36.7 C) (!) 101.1 F (38.4 C)  TempSrc: Oral Axillary Oral Axillary  SpO2: 100% 100% 100% 100%  Weight:      Height:        Intake/Output Summary (Last 24 hours) at 12/20/16 1533 Last data filed at 12/19/16 1716  Gross per 24 hour  Intake                0 ml  Output              725 ml  Net             -725 ml   Filed Weights   12/19/16 1711  Weight: 83 kg (183 lb)    Exam  General: Well developed, well nourished, NAD  HEENT: NCAT,  mucous membranes moist.   Cardiovascular: S1 S2 auscultated, RRR  Respiratory: Clear to auscultation bilaterally with equal chest rise  Abdomen: Soft, nontender, nondistended, + bowel sounds  Extremities: warm dry without cyanosis clubbing or edema  Neuro: Difficult to arouse.   Psych: Cannot evaluate   Data Reviewed: I have personally reviewed following labs and imaging studies  CBC:  Recent Labs Lab 12/18/16 2022 12/18/16 2044 12/20/16 0545  WBC 9.1  --  8.7  NEUTROABS 6.2  --   --   HGB 14.6 15.0 12.0*  HCT 42.1 44.0 37.0*  MCV 101.0*  --  102.8*  PLT 175  --  195   Basic Metabolic Panel:  Recent Labs Lab 12/18/16 2044  12/18/16 2236  NA 141 143  K 6.3* 4.0  CL 104 106  CO2  --  28  GLUCOSE 137* 122*  BUN 38* 26*  CREATININE 1.50* 1.44*  CALCIUM  --  9.1   GFR: Estimated Creatinine Clearance: 45 mL/min (A) (by C-G formula based on SCr of 1.44 mg/dL (H)). Liver Function Tests:  Recent Labs Lab 12/18/16 2236  AST 20  ALT 24  ALKPHOS 83  BILITOT 0.7  PROT 7.2  ALBUMIN 3.7   No results for input(s): LIPASE, AMYLASE in the last 168 hours. No results for input(s): AMMONIA in the last 168 hours. Coagulation Profile:  Recent Labs Lab 12/18/16 2022 12/19/16 1626 12/20/16 0545  INR 2.17 1.90 1.84   Cardiac Enzymes: No results for input(s): CKTOTAL, CKMB,  CKMBINDEX, TROPONINI in the last 168 hours. BNP (last 3 results) No results for input(s): PROBNP in the last 8760 hours. HbA1C: No results for input(s): HGBA1C in the last 72 hours. CBG: No results for input(s): GLUCAP in the last 168 hours. Lipid Profile: No results for input(s): CHOL, HDL, LDLCALC, TRIG, CHOLHDL, LDLDIRECT in the last 72 hours. Thyroid Function Tests: No results for input(s): TSH, T4TOTAL, FREET4, T3FREE, THYROIDAB in the last 72 hours. Anemia Panel: No results for input(s): VITAMINB12, FOLATE, FERRITIN, TIBC, IRON, RETICCTPCT in the last 72 hours. Urine analysis:    Component Value Date/Time   COLORURINE YELLOW 12/19/2016 0906   APPEARANCEUR HAZY (A) 12/19/2016 0906   LABSPEC 1.018 12/19/2016 0906   PHURINE 5.0 12/19/2016 0906   GLUCOSEU 50 (A) 12/19/2016 0906   HGBUR NEGATIVE 12/19/2016 0906   BILIRUBINUR NEGATIVE 12/19/2016 0906   KETONESUR NEGATIVE 12/19/2016 0906   PROTEINUR NEGATIVE 12/19/2016 0906   NITRITE NEGATIVE 12/19/2016 0906   LEUKOCYTESUR NEGATIVE 12/19/2016 0906   Sepsis Labs: (procalcitonin:4,lacticidven:4)  ) Recent Results (from the past 240 hour(s))  MRSA PCR Screening     Status: None   Collection Time: 12/19/16  6:26 AM  Result Value Ref Range Status   MRSA by PCR NEGATIVE NEGATIVE Final    Comment:        The GeneXpert MRSA Assay (FDA approved for NASAL specimens only), is one component of a comprehensive MRSA colonization surveillance program. It is not intended to diagnose MRSA infection nor to guide or monitor treatment for MRSA infections.       Radiology Studies: Dg Chest 2 View  Result Date: 12/18/2016 CLINICAL DATA:  Altered mental status. Nonresponsive. Abnormal lung sounds on exam. EXAM: CHEST  2 VIEW COMPARISON:  01/09/2016 FINDINGS: The heart size and mediastinal contours are within normal limits. Aortic atherosclerosis. Low lung volumes noted. Both lungs are clear. IMPRESSION: Low lung volumes.  No  active disease. Electronically Signed   By: Myles Rosenthal M.D.   On: 12/18/2016 21:11   Ct Head Wo Contrast  Result Date: 12/18/2016 CLINICAL DATA:  Altered mental status EXAM: CT HEAD WITHOUT CONTRAST TECHNIQUE: Contiguous axial images were obtained from the base of the skull through the vertex without intravenous contrast. COMPARISON:  06/01/2015 FINDINGS: Brain: Motion degraded study. No gross hemorrhage or mass is visualized. Hypodensity within the left thalamus, new since comparison CT from 2017. Additional small hypodensity in the right thalamus. Atrophy with small vessel ischemic changes of the white matter. Vascular: No hyperdense vessels.  Carotid artery calcification. Skull: No fracture or suspicious lesion. Fluid within the right mastoid air cells. Sinuses/Orbits: Mucosal thickening within the frontal and ethmoid sinuses. No acute orbital abnormality Other: None IMPRESSION: 1. Hypodensity  within the left thalamus, new since 2017, and suspected to represent acute infarct. Could obtain MRI for confirmation. Smaller hypodensity in the right thalamus fell consistent with a age indeterminate lacunar infarct. No hemorrhage is seen allowing for motion degradation 2. Atrophy with small vessel ischemic changes of the white matter 3. Right mastoid effusion Electronically Signed   By: Jasmine Pang M.D.   On: 12/18/2016 21:06   [name] Wo Contrast  Result Date: 12/19/2016 CLINICAL DATA:  Follow-up infarct. EXAM: MRA HEAD WITHOUT CONTRAST TECHNIQUE: Angiographic images of the Circle of Willis were obtained using MRA technique without intravenous contrast. COMPARISON:  MRI of the head December 18, 2016 FINDINGS: Moderately motion degraded examination predominately at and above the circle Willis. ANTERIOR CIRCULATION: Flow related enhancement of the included cervical, petrous, cavernous and supraclinoid internal carotid arteries. Patent anterior communicating artery. Patent proximal anterior and middle cerebral  arteries. Probable azygos ACA. No large vessel occlusion or aneurysm. POSTERIOR CIRCULATION: Codominant vertebral artery's. Patent basilar artery. Posterior-inferior cerebellar artery is not well demonstrated. Limited assessment of main branch vessels. High-grade stenosis versus motion artifact posterior cerebral artery's. No large vessel occlusion or aneurysm. ANATOMIC VARIANTS: None. Source images and MIP images were reviewed. IMPRESSION: 1. Limited motion degraded examination. No emergent large vessel occlusion. 2. Severe stenosis versus motion artifact proximal posterior cerebral artery's. Electronically Signed   By: Awilda Metro M.D.   On: 12/19/2016 06:52   Mr Brain Wo Contrast  Result Date: 12/18/2016 CLINICAL DATA:  Altered mental status this morning.  Unresponsive. EXAM: MRI HEAD WITHOUT CONTRAST TECHNIQUE: Multiplanar, multiecho pulse sequences of the brain and surrounding structures were obtained without intravenous contrast. COMPARISON:  CT HEAD December 18, 2016 at 2044 hours FINDINGS: Due to habitus, patient scanned on side with old head coil resulting in suboptimal examination. Moderately motion degraded examination. BRAIN: Bilateral mesial thalamus reduced diffusion measuring to 12 mm on the LEFT, no ADC abnormality. No susceptibility artifact to suggest hemorrhage though motion degrades sensitivity. Ventricles and sulci are normal for patient's age. No midline shift, mass effect or definite masses. Patchy supratentorial white matter FLAIR T2 hyperintensities compatible with mild chronic small vessel ischemic disease, less than expected for age. No abnormal extra-axial fluid collections. VASCULAR: Normal major intracranial vascular flow voids present at skull base. SKULL AND UPPER CERVICAL SPINE: No abnormal sellar expansion. No suspicious calvarial bone marrow signal. Craniocervical junction maintained. SINUSES/ORBITS: RIGHT mastoid effusion. Paranasal sinuses well aerated. The included  ocular globes and orbital contents are non-suspicious. OTHER: None. IMPRESSION: 1. Habitus and motion degraded examination. 2. Subacute bilateral thalamus infarcts. Findings may reflect artery of percheron infarctions, basilar tip thrombosis, or dural venous sinus occlusion. Findings less likely reflect Wernicke's encephalopathy. 3. Given limited MRI examination, if vascular imaging is performed, recommend CT angiogram. Electronically Signed   By: Awilda Metro M.D.   On: 12/18/2016 22:41     Scheduled Meds: . levothyroxine  25 mcg Intravenous Daily  . thiamine injection  100 mg Intravenous Daily   Continuous Infusions: . sodium chloride 70 mL/hr at 12/19/16 1556  . heparin 1,100 Units/hr (12/20/16 1253)     LOS: 1 day   Time Spent in minutes   30 minutes  Zakai Gonyea D.O. on 12/20/2016 at 3:33 PM  Between 7am to 7pm - Pager - [phone]  After 7pm go to [email or web address] - password [email or web address]  And look for the night coverage person covering for me after hours  [facility] Office  [phone]

## 2016-12-20 NOTE — Care Management Note (Signed)
Case Management Note  Patient Details  Name: Nyaire Denbleyker MRN: [id number] Date of Birth: [birth year]  Subjective/Objective:      Pt admitted with CVA. He is from [facility].               Action/Plan: PT/OT recommending SNF. Plan is for patient to return to his SNF. CM following.   Expected Discharge Date:                  Expected Discharge Plan:  Skilled Nursing Facility  In-House Referral:  Clinical Social Work  Discharge planning Services     Post Acute Care Choice:    Choice offered to:     DME Arranged:    DME Agency:     HH Arranged:    HH Agency:     Status of Service:  In process, will continue to follow  If discussed at Long Length of Stay Meetings, dates discussed:    Additional Comments:  Kermit Balo, RN 12/20/2016, 10:10 AM

## 2016-12-20 NOTE — Progress Notes (Addendum)
ANTICOAGULATION CONSULT NOTE - Initial Consult  Pharmacy Consult for warfarin/ heparin Indication: atrial fibrillation  Allergies  Allergen Reactions  . Neomycin-Bacitracin Zn-Polymyx Hives  . Benzalkonium Chloride Rash    Patient Measurements: Height: 5'11" Weight: 183 lbs  Heparin dosing weight: 78 Kg   Vital Signs: Temp: 98 F (36.7 C) (10/03 1018) Temp Source: Oral (10/03 1018) BP: 149/80 (10/03 1018) Pulse Rate: 89 (10/03 1018)  Labs:  Recent Labs  12/18/16 2022 12/18/16 2044 12/18/16 2236 12/19/16 1626 12/20/16 0545  HGB 14.6 15.0  --   --  12.0*  HCT 42.1 44.0  --   --  37.0*  PLT 175  --   --   --  195  APTT 32  --   --   --   --   LABPROT 24.0*  --   --  21.7* 21.1*  INR 2.17  --   --  1.90 1.84  CREATININE  --  1.50* 1.44*  --   --     Medical History: Past Medical History:  Diagnosis Date  . DM2 (diabetes mellitus, type 2) (HCC)   . Hypothyroidism   . Orthostatic hypotension   . PAF (paroxysmal atrial fibrillation) (HCC)   . Parkinson's disease (HCC)   . Stroke [facility])     Medications:  Scheduled:  . levothyroxine  25 mcg Intravenous Daily  . 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 He has a history of atrial fibrillation, on 2 mg warfarin prior to admission. Last dose was on 9/30. INR on admission was 2.17.   INR 1.8 today, patient is NPO, and primary provider has asked to start heparin infusion. HgB down slightly from admission 14.6>>12.0, no bleeding documented.  Goal of Therapy:  INR 2-3 Monitor platelets by anticoagulation protocol: Yes   Plan:  Heparin gtt at 1100 units/hr Hold warfarin while NPO Monitor heparin level, CBC, INR, and s/sx of bleeding   Ruben Im, PharmD Clinical Pharmacist 12/20/2016 12:36 PM

## 2016-12-20 NOTE — Progress Notes (Signed)
**  Preliminary report by tech**  Carotid artery duplex complete. Unable to visualize right sided vessels due to patient positioning, and immobility. Findings are consistent with a 1-39 percent stenosis involving the left internal carotid artery. The left vertebral artery demonstrates antegrade flow.  12/20/16 3:41 PM Olen Cordial RVT

## 2016-12-20 NOTE — Evaluation (Signed)
Speech Language Pathology Evaluation Patient Details Name: Suraj Ramdass MRN: [id number] DOB: 11/29/1935 Today's Date: 12/20/2016 Time: 4098-1191 SLP Time Calculation (min) (ACUTE ONLY): 22 min  Problem List:  Patient Active Problem List   Diagnosis Date Noted  . 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 DM2 (diabetes mellitus, type 2) (HCC) 12/19/2016  . Hypothyroidism 12/19/2016  . PAF (paroxysmal atrial fibrillation) (HCC) 12/19/2016  . Parkinson's disease (HCC) 12/19/2016  . Dementia due to Parkinson's disease without behavioral disturbance (HCC) 12/19/2016  . Pressure injury of skin 12/19/2016   Past Medical History:  Past Medical History:  Diagnosis Date  . DM2 (diabetes mellitus, type 2) (HCC)   . Hypothyroidism   . Orthostatic hypotension   . PAF (paroxysmal atrial fibrillation) (HCC)   . Parkinson's disease (HCC)   . Stroke [facility])    Past Surgical History:  Past Surgical History:  Procedure Laterality Date  . COLONOSCOPY    . INGUINAL HERNIA REPAIR     HPI:  81 y.o. male with a history of atrial fibrillation, stroke, GERD, dementia with Parkinson's disease who presents with decreased mental status. MRI showing subacute bilateral thalamic infarcts.  Pt was apparently on soft diet PTA.    Assessment / Plan / Recommendation Clinical Impression  Pt with poor arousal.  Per RN/therapy notes, he has demonstrated intermittent ability to follow a command, wave in response to others, or verbalize a simple message with low volume output.  Today, despite efforts to stimulate with sternal rub, oral care, verbal/tactile cues, pt was unable to follow commands nor engage with this clinician in any meaningful way.  Will follow for ongoing diagnostic treatment and await neurology's thoughts on prognosis.     SLP Assessment  SLP Recommendation/Assessment: Patient needs continued Speech Lanaguage Pathology Services SLP Visit Diagnosis: Dysphagia,  unspecified (R13.10)    Follow Up Recommendations  Skilled Nursing facility    Frequency and Duration min 3x week  2 weeks      SLP Evaluation Cognition  Overall Cognitive Status: Impaired/Different from baseline Arousal/Alertness: Lethargic Orientation Level: Disoriented X4 Attention: Focused Focused Attention: Impaired       Comprehension  Auditory Comprehension Overall Auditory Comprehension: Impaired Yes/No Questions: Impaired Basic Biographical Questions: 0-25% accurate Commands: Impaired One Step Basic Commands: 0-24% accurate Visual Recognition/Discrimination Discrimination: Not tested    Expression Verbal Expression Overall Verbal Expression: Impaired Initiation: Impaired   Oral / Motor  Oral Motor/Sensory Function Overall Oral Motor/Sensory Function: Other (comment) (symmetry at rest) Motor Speech Overall Motor Speech: Impaired   GO                    Blenda Mounts Laurice 12/20/2016, 10:58 AM

## 2016-12-20 NOTE — Progress Notes (Signed)
ANTICOAGULATION CONSULT NOTE - Follow Up Consult  Pharmacy Consult for warfarin/ heparin Indication: atrial fibrillation  Allergies  Allergen Reactions  . Neomycin-Bacitracin Zn-Polymyx Hives  . Benzalkonium Chloride Rash    Patient Measurements: Height: 5'11" Weight: 183 lbs  Heparin dosing weight: 78 Kg   Vital Signs: Temp: 99.1 F (37.3 C) (10/03 2158) Temp Source: Axillary (10/03 2158) BP: 164/89 (10/03 2158) Pulse Rate: 85 (10/03 2158)  Labs:  Recent Labs  12/18/16 2022 12/18/16 2044 12/18/16 2236 12/19/16 1626 12/20/16 0545 12/20/16 2108  HGB 14.6 15.0  --   --  12.0*  --   HCT 42.1 44.0  --   --  37.0*  --   PLT 175  --   --   --  195  --   APTT 32  --   --   --   --   --   LABPROT 24.0*  --   --  21.7* 21.1*  --   INR 2.17  --   --  1.90 1.84  --   HEPARINUNFRC  --   --   --   --   --  0.59  CREATININE  --  1.50* 1.44*  --   --   --     Medical History: Past Medical History:  Diagnosis Date  . DM2 (diabetes mellitus, type 2) (HCC)   . Hypothyroidism   . Orthostatic hypotension   . PAF (paroxysmal atrial fibrillation) (HCC)   . Parkinson's disease (HCC)   . Stroke [facility])     Medications:  Scheduled:  . levothyroxine  25 mcg Intravenous Daily  . 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 He has a history of atrial fibrillation warfarin at home.   Currently on IV heparin at 1100 units/hr. Initial HL is therapeutic at 0.59. RN reports no s/s of bleeding   Goal of Therapy:  INR 2-3 Monitor platelets by anticoagulation protocol: Yes   Plan:  Continue heparin gtt at 1100 units/hr Hold warfarin while NPO F/u AM confirmatory HL  Monitor daily heparin level, CBC, INR, and s/sx of bleeding   Vinnie Level, PharmD., BCPS Clinical Pharmacist Pager [phone]

## 2016-12-20 NOTE — Progress Notes (Signed)
  Echocardiogram 2D Echocardiogram has been performed.  Leta Jungling M 12/20/2016, 9:51 AM

## 2016-12-20 NOTE — Evaluation (Signed)
Clinical/Bedside Swallow Evaluation Patient Details  Name: Grayton Lobo MRN: [id number] Date of Birth: 1936-01-06  Today's Date: 12/20/2016 Time: SLP Start Time (ACUTE ONLY): 1000 SLP Stop Time (ACUTE ONLY): 1020 SLP Time Calculation (min) (ACUTE ONLY): 20 min  Past Medical History:  Past Medical History:  Diagnosis Date  . DM2 (diabetes mellitus, type 2) (HCC)   . Hypothyroidism   . Orthostatic hypotension   . PAF (paroxysmal atrial fibrillation) (HCC)   . Parkinson's disease (HCC)   . Stroke [facility])    Past Surgical History:  Past Surgical History:  Procedure Laterality Date  . COLONOSCOPY    . INGUINAL HERNIA REPAIR     HPI:  81 y.o. male with a history of atrial fibrillation, stroke, GERD, dementia with Parkinson's disease who presents with decreased mental status. MRI showing subacute bilateral thalamic infarcts.  Pt was apparently on soft diet PTA.    Assessment / Plan / Recommendation Clinical Impression  Pt presents with dysphagia with poor arousal due to bilateral thalamic CVA. Oral care provided; pt intermittently would open eyes and occasionally whisper in response to clinician, but speech was not intelligible.  Did not follow commands.  No obvious focal CN deficits.  Accepted limited boluses of ice chips, tspns water, which elicited consistent coughing.  Maintained eyes closed throughout session.  Pt is just not sufficiently alert to eat/drink.  No family present.  D/W RN.  Will continue to follow for improvements and potential readiness for PO diet.   SLP Visit Diagnosis: Dysphagia, unspecified (R13.10)    Aspiration Risk  Severe aspiration risk    Diet Recommendation   Continue NPO        Other  Recommendations Oral Care Recommendations: Oral care QID;Oral care prior to ice chip/H20   Follow up Recommendations        Frequency and Duration min 3x week  2 weeks       Prognosis Prognosis for Safe Diet Advancement: Fair      Swallow Study   General  Date of Onset: 12/18/16 HPI: 81 y.o. male with a history of atrial fibrillation, stroke, GERD, dementia with Parkinson's disease who presents with decreased mental status. MRI showing subacute bilateral thalamic infarcts.  Pt was apparently on soft diet PTA.  Type of Study: Bedside Swallow Evaluation Diet Prior to this Study: NPO Temperature Spikes Noted: No Respiratory Status: Nasal cannula History of Recent Intubation: No Behavior/Cognition: Lethargic/Drowsy Oral Cavity Assessment: Dried secretions Oral Care Completed by SLP: Yes Oral Cavity - Dentition: Adequate natural dentition Vision:  (n/a) Self-Feeding Abilities: Total assist Patient Positioning: Upright in bed Baseline Vocal Quality: Low vocal intensity Volitional Cough: Cognitively unable to elicit Volitional Swallow: Unable to elicit    Oral/Motor/Sensory Function Overall Oral Motor/Sensory Function: Other (comment) (symmetry at rest - does not follow commands)   Ice Chips Ice chips: Impaired Presentation: Spoon Oral Phase Impairments: Poor awareness of bolus Pharyngeal Phase Impairments: Cough - Delayed   Thin Liquid Thin Liquid: Impaired Presentation: Spoon Oral Phase Impairments: Poor awareness of bolus Pharyngeal  Phase Impairments: Cough - Immediate    Nectar Thick Nectar Thick Liquid: Not tested   Honey Thick Honey Thick Liquid: Not tested   Puree Puree: Not tested   Solid   GO   Solid: Not tested        Blenda Mounts Laurice 12/20/2016,10:50 AM

## 2016-12-20 NOTE — Progress Notes (Signed)
STROKE TEAM PROGRESS NOTE   HISTORY OF PRESENT ILLNESS (per record) Andrew Ochoa is a 81 y.o. male with a history of atrial fibrillation, stroke, dementia with Parkinson's disease who presents with decreased mental status. He was last normal evening of 9/30. Today he has been difficult to arouse and therefore he was brought into the emergency department. In ER, he was found have bilateral thalamic strokes.  He is on Coumadin for his atrial fibrillation and apparently (per nursing) does not walk at baseline. According to his last neurology note from [facility], he has difficulty getting information from the patient and he has his head tilted to the right. He does not walk at all, but can feed himself 1 alert.  LKW: 9/30 Premorbid modified rankin scale: 4  Patient was not administered IV t-PA secondary to outside window.   SUBJECTIVE (INTERVAL HISTORY) No family at bedside this morning. He is sleeping and difficult to arouse  He does not follow verbal commands or answer questions for [place]. He remains NPO due to diminished level of alertness and stroke.He is not on IV heparin drip   OBJECTIVE Temp:  [98 F (36.7 C)-101.1 F (38.4 C)] 101.1 F (38.4 C) (10/03 1418) Pulse Rate:  [69-89] 88 (10/03 1418) Cardiac Rhythm: Normal sinus rhythm (10/03 0700) Resp:  [20] 20 (10/03 1418) BP: (113-158)/(68-91) 158/78 (10/03 1418) SpO2:  [100 %] 100 % (10/03 1418) Weight:  [183 lb (83 kg)] 183 lb (83 kg) (10/02 1711)  CBC:   Recent Labs Lab 12/18/16 2022 12/18/16 2044 12/20/16 0545  WBC 9.1  --  8.7  NEUTROABS 6.2  --   --   HGB 14.6 15.0 12.0*  HCT 42.1 44.0 37.0*  MCV 101.0*  --  102.8*  PLT 175  --  195    Basic Metabolic Panel:   Recent Labs Lab 12/18/16 2044 12/18/16 2236  NA 141 143  K 6.3* 4.0  CL 104 106  CO2  --  28  GLUCOSE 137* 122*  BUN 38* 26*  CREATININE 1.50* 1.44*  CALCIUM  --  9.1    Lipid Panel: No results found for: CHOL, TRIG, HDL, CHOLHDL, VLDL,  LDLCALC HgbA1c: No results found for: HGBA1C Urine Drug Screen:     Component Value Date/Time   LABOPIA NONE DETECTED 12/19/2016 0906   COCAINSCRNUR NONE DETECTED 12/19/2016 0906   LABBENZ NONE DETECTED 12/19/2016 0906   AMPHETMU NONE DETECTED 12/19/2016 0906   THCU NONE DETECTED 12/19/2016 0906   LABBARB NONE DETECTED 12/19/2016 0906    Alcohol Level     Component Value Date/Time   ETH <10 12/18/2016 2236    IMAGING  Dg Chest 2 View 12/18/2016 IMPRESSION: Low lung volumes.  No active disease.  Ct Head Wo Contrast 12/18/2016 IMPRESSION: 1. Hypodensity within the left thalamus, new since 2017, and suspected to represent acute infarct. Could obtain MRI for confirmation. Smaller hypodensity in the right thalamus fell consistent with a age indeterminate lacunar infarct. No hemorrhage is seen allowing for motion degradation 2. Atrophy with small vessel ischemic changes of the white matter 3. Right mastoid effusion Electronically Signed   By: Jasmine Pang M.D.   On: 12/18/2016 21:06   [name] Wo Contrast 12/19/2016 IMPRESSION: 1. Limited motion degraded examination. No emergent large vessel occlusion. 2. Severe stenosis versus motion artifact proximal posterior cerebral artery's.  Mr Brain Wo Contrast 12/18/2016 IMPRESSION: 1. Habitus and motion degraded examination. 2. Subacute bilateral thalamus infarcts. Findings may reflect artery of percheron infarctions, basilar  tip thrombosis, or dural venous sinus occlusion. Findings less likely reflect Wernicke's encephalopathy. 3. Given limited MRI examination, if vascular imaging is performed, recommend CT angiogram.   PHYSICAL EXAM Constitutional: Frail malnourished looking Elderly man lying asleep in fixed position in bed with neck deviated to right Eyes: Not following command, reactive Neuro: Mental Status: Awakens to noxious stimuli, but he keeps his eyes tightly closed. He does not follow commands.speaks only in response to noxious  stimuli CN: Eyes shut, reactive pupils, relative facial symmetry, neck fixed flexed towards right shoulder Motor: moderate rigidity versus paratonia in bilateral upper extremities. He localizes with his right arm to stimuli. Legs withdraw to noxious stimuli Sensory: Difficult to assess changes Cerebellar: Not performing tasks Gait deferred  ASSESSMENT/PLAN Mr. Andrew Ochoa is a 81 y.o. male with history of Afib, stroke, dementia, Parkinson's disease presenting with decreased level of consciousness. He did not receive IV t-PA due to outside window.   Stroke in bilateral thalamus presumably embolic secondary to afib while on therapeutic dose coumadin  Resultant  Decreased level of alertness  CT head left and smaller right thalamus hypodensity suggesting acute infarct  MRI head subacute bilateral thalamic infarctions  MRA head Severe stenosis vs motion artifact proximal PCAs  Carotid Doppler  pending  2D Echo  pending  LDL pending  HgbA1c pending  Lovenox for VTE prophylaxis Diet NPO time specified  warfarin daily prior to admission, now on iv heparin  Ongoing aggressive stroke risk factor management  Therapy recommendations:  SNF rehab  Disposition:  pending  Hypertension  Stable  Permissive hypertension (OK if < 220/120) but gradually normalize in 5-7 days  Long-term BP goal normotensive  Hyperlipidemia  Home meds:  Lovastatin , held so far for NPO  LDL pending, goal < 70  Continue statin at discharge  Diabetes  HgbA1c pending, goal < 7.0  Presume controlled  Other Stroke Risk Factors  Advanced age  Hx stroke/TIA  Other Active Problems  Parkinson's disease- He is currently NPO for both lethargy and inability to assess swallow function. This may not improve quickly considering location of infarct limiting level of consciousness. Probably needs at least temporary gastric tube access for critical medications- sinemet, warfarin,  statin.  Recurrent stroke on therapeutic coumadin, we can consider transition to DOAC will discuss with pharmacy  Hospital day # 1  I have personally examined this patient, reviewed notes, independently viewed imaging studies, participated in medical decision making and plan of care.ROS completed by me personally and pertinent positives fully documented  I have made any additions or clarifications directly to the above note. .  Patient is clearly unable to swallow and I doubt that his swallow function will improve soon. He will need temporary panda tube and consideration for PEG tube if long-term enteral feeding his chosen by the family. Continue IV heparin till is able to swallow and start warfarin back Discussed with Dr. Catha Gosselin. Family to decide about palliative care versus continuing aggressive care Greater than 50% time during this 25 minute visit was spent on counseling and coordination of care about his embolic stroke, baseline dementia parkinsonism and answering questions. Delia Heady, MD Medical Director [facility] Pager: [phone] 12/20/2016 3:19 PM    To contact Stroke Continuity provider, please refer to [email or web address]. After hours, contact General Neurology

## 2016-12-21 LAB — ECHOCARDIOGRAM COMPLETE
HEIGHTINCHES: 71 in
WEIGHTICAEL: 2928 [oz_av]

## 2016-12-21 LAB — CBC
HEMATOCRIT: 36.3 % — AB (ref 39.0–52.0)
HEMOGLOBIN: 11.5 g/dL — AB (ref 13.0–17.0)
MCH: 32 pg (ref 26.0–34.0)
MCHC: 31.7 g/dL (ref 30.0–36.0)
MCV: 101.1 fL — AB (ref 78.0–100.0)
Platelets: 165 10*3/uL (ref 150–400)
RBC: 3.59 MIL/uL — ABNORMAL LOW (ref 4.22–5.81)
RDW: 12.7 % (ref 11.5–15.5)
WBC: 6.1 10*3/uL (ref 4.0–10.5)

## 2016-12-21 LAB — HEPARIN LEVEL (UNFRACTIONATED)
HEPARIN UNFRACTIONATED: 0.61 [IU]/mL (ref 0.30–0.70)
Heparin Unfractionated: 0.76 IU/mL — ABNORMAL HIGH (ref 0.30–0.70)

## 2016-12-21 LAB — BASIC METABOLIC PANEL
ANION GAP: 7 (ref 5–15)
BUN: 18 mg/dL (ref 6–20)
CHLORIDE: 106 mmol/L (ref 101–111)
CO2: 25 mmol/L (ref 22–32)
Calcium: 8.8 mg/dL — ABNORMAL LOW (ref 8.9–10.3)
Creatinine, Ser: 1.14 mg/dL (ref 0.61–1.24)
GFR calc Af Amer: >60 mL/min (ref >60)
GFR calc non Af Amer: 60 mL/min — ABNORMAL LOW (ref >60)
Glucose, Bld: 79 mg/dL (ref 65–99)
POTASSIUM: 3.9 mmol/L (ref 3.5–5.1)
SODIUM: 138 mmol/L (ref 135–145)

## 2016-12-21 LAB — PROTIME-INR
INR: 1.99
Prothrombin Time: 22.5 seconds — ABNORMAL HIGH (ref 11.4–15.2)

## 2016-12-21 LAB — VITAMIN B1: Vitamin B1 (Thiamine): 154.6 nmol/L (ref 66.5–200.0)

## 2016-12-21 MED ORDER — HEPARIN (PORCINE) IN NACL 100-0.45 UNIT/ML-% IJ SOLN
850.0000 [IU]/h | INTRAMUSCULAR | Status: DC
  Administered 2016-12-21: 950 [IU]/h via INTRAVENOUS
  Administered 2016-12-22 – 2016-12-23 (×2): 850 [IU]/h via INTRAVENOUS
  Filled 2016-12-21 (×3): qty 250

## 2016-12-21 NOTE — Progress Notes (Signed)
STROKE TEAM PROGRESS NOTE   HISTORY OF PRESENT ILLNESS (per record) Andrew Ochoa is a 81 y.o. male with a history of atrial fibrillation, stroke, dementia with Parkinson's disease who presents with decreased mental status. He was last normal evening of 9/30. Today he has been difficult to arouse and therefore he was brought into the emergency department. In ER, he was found have bilateral thalamic strokes.  He is on Coumadin for his atrial fibrillation and apparently (per nursing) does not walk at baseline. According to his last neurology note from [facility], he has difficulty getting information from the patient and he has his head tilted to the right. He does not walk at all, but can feed himself 1 alert.  LKW: 9/30 Premorbid modified rankin scale: 4  Patient was not administered IV t-PA secondary to outside window.   SUBJECTIVE (INTERVAL HISTORY) No family at bedside this morning. He is sleeping and difficult to arouse  He does not follow verbal commands or answer questions for [place]. He remains NPO due to diminished level of alertness and stroke.He is   on IV heparin drip.Family is to meet with palliative care team to address goals of care OBJECTIVE Temp:  [99.1 F (37.3 C)-100.8 F (38.2 C)] 99.1 F (37.3 C) (10/04 1215) Pulse Rate:  [77-93] 77 (10/04 1215) Cardiac Rhythm: Normal sinus rhythm (10/03 1900) Resp:  [16-18] 16 (10/04 1215) BP: (141-176)/(78-91) 174/86 (10/04 1215) SpO2:  [100 %] 100 % (10/04 1215)  CBC:   Recent Labs Lab 12/18/16 2022  12/20/16 0545 12/21/16 0717  WBC 9.1  --  8.7 6.1  NEUTROABS 6.2  --   --   --   HGB 14.6  < > 12.0* 11.5*  HCT 42.1  < > 37.0* 36.3*  MCV 101.0*  --  102.8* 101.1*  PLT 175  --  195 165  < > = values in this interval not displayed.  Basic Metabolic Panel:   Recent Labs Lab 12/18/16 2236 12/21/16 0717  NA 143 138  K 4.0 3.9  CL 106 106  CO2 28 25  GLUCOSE 122* 79  BUN 26* 18  CREATININE 1.44* 1.14  CALCIUM  9.1 8.8*    Lipid Panel:     Component Value Date/Time   CHOL 151 12/20/2016 1600   TRIG 92 12/20/2016 1600   HDL 39 (L) 12/20/2016 1600   CHOLHDL 3.9 12/20/2016 1600   VLDL 18 12/20/2016 1600   LDLCALC 94 12/20/2016 1600   HgbA1c:  Lab Results  Component Value Date   HGBA1C 5.8 (H) 12/20/2016   Urine Drug Screen:     Component Value Date/Time   LABOPIA NONE DETECTED 12/19/2016 0906   COCAINSCRNUR NONE DETECTED 12/19/2016 0906   LABBENZ NONE DETECTED 12/19/2016 0906   AMPHETMU NONE DETECTED 12/19/2016 0906   THCU NONE DETECTED 12/19/2016 0906   LABBARB NONE DETECTED 12/19/2016 0906    Alcohol Level     Component Value Date/Time   ETH <10 12/18/2016 2236    IMAGING  Dg Chest 2 View 12/18/2016 IMPRESSION: Low lung volumes.  No active disease.  Ct Head Wo Contrast 12/18/2016 IMPRESSION: 1. Hypodensity within the left thalamus, new since 2017, and suspected to represent acute infarct. Could obtain MRI for confirmation. Smaller hypodensity in the right thalamus fell consistent with a age indeterminate lacunar infarct. No hemorrhage is seen allowing for motion degradation 2. Atrophy with small vessel ischemic changes of the white matter 3. Right mastoid effusion Electronically Signed   By: Selena Batten  Jake Samples M.D.   On: 12/18/2016 21:06   [name] Wo Contrast 12/19/2016 IMPRESSION: 1. Limited motion degraded examination. No emergent large vessel occlusion. 2. Severe stenosis versus motion artifact proximal posterior cerebral artery's.  Mr Brain Wo Contrast 12/18/2016 IMPRESSION: 1. Habitus and motion degraded examination. 2. Subacute bilateral thalamus infarcts. Findings may reflect artery of percheron infarctions, basilar tip thrombosis, or dural venous sinus occlusion. Findings less likely reflect Wernicke's encephalopathy. 3. Given limited MRI examination, if vascular imaging is performed, recommend CT angiogram.   PHYSICAL EXAM Constitutional: Frail malnourished looking  Elderly man lying asleep in fixed position in bed with neck deviated to right Eyes: Not following command, reactive Neuro: Mental Status: Awakens to noxious stimuli, but he keeps his eyes tightly closed. He does not follow commands.speaks only in response to noxious stimuli CN: Eyes shut, reactive pupils, relative facial symmetry, neck fixed flexed towards right shoulder Motor: moderate rigidity versus paratonia in bilateral upper extremities. He localizes with his right arm to stimuli. Legs withdraw to noxious stimuli Sensory: Difficult to assess changes Cerebellar: Not performing tasks Gait deferred  ASSESSMENT/PLAN Mr. Andrew Ochoa is a 81 y.o. male with history of Afib, stroke, dementia, Parkinson's disease presenting with decreased level of consciousness. He did not receive IV t-PA due to outside window.   Stroke in bilateral thalamus presumably embolic secondary to afib while on therapeutic dose coumadin  Resultant  Decreased level of alertness  CT head left and smaller right thalamus hypodensity suggesting acute infarct  MRI head subacute bilateral thalamic infarctions  MRA head Severe stenosis vs motion artifact proximal PCAs Carotid Doppler Unable to visualize right sided vessels due to patient positioning, and immobility.  Findings are consistent with a 1-39 percent stenosis involving the left internal carotid artery. The left vertebral artery demonstrates antegrade flow. 2D Echo  Left ventricle: The cavity size was normal. Wall thickness was   increased in a pattern of moderate LVH. Systolic function was   normal. The estimated ejection fraction was in the range of 60%   to 65%. Wall motion was normal; there were no regional wall    motion abnormalities  LDL 94  HgbA1c 5.8  Lovenox for VTE prophylaxis Diet NPO time specified  warfarin daily prior to admission, now on iv heparin  Ongoing aggressive stroke risk factor management  Therapy recommendations:  SNF  rehab  Disposition:  pending  Hypertension  Stable  Permissive hypertension (OK if < 220/120) but gradually normalize in 5-7 days  Long-term BP goal normotensive  Hyperlipidemia  Home meds:  Lovastatin , held so far for NPO  LDL pending, goal < 70  Continue statin at discharge  Diabetes  HgbA1c pending, goal < 7.0  Presume controlled  Other Stroke Risk Factors  Advanced age  Hx stroke/TIA  Other Active Problems  Parkinson's disease- He is currently NPO for both lethargy and inability to assess swallow function. This may not improve quickly considering location of infarct limiting level of consciousness. Probably needs at least temporary gastric tube access for critical medications- sinemet, warfarin, statin.  Recurrent stroke on therapeutic coumadin, we can consider transition to DOAC will discuss with pharmacy  Hospital day # 2  I have personally examined this patient, reviewed notes, independently viewed imaging studies, participated in medical decision making and plan of care.ROS completed by me personally and pertinent positives fully documented  I have made any additions or clarifications directly to the above note. .  Patient is clearly unable to swallow and I doubt  that his swallow function will improve soon. He will need temporary panda tube and consideration for PEG tube if long-term enteral feeding his chosen by the family. Continue IV heparin till is able to swallow and start warfarin back Discussed with Dr. Catha Gosselin. Family to decide about palliative care versus continuing aggressive care to obtain will sign off. Kindly call for questions.  Delia Heady, MD Medical Director [facility] Pager: [phone] 12/21/2016 3:50 PM    To contact Stroke Continuity provider, please refer to [email or web address]. After hours, contact General Neurology

## 2016-12-21 NOTE — Progress Notes (Signed)
PROGRESS NOTE    Andrew Ochoa  ZOX:[id number] DOB: April 19, 1935 DOA: 12/18/2016 PCP: System, Provider Not In   No chief complaint on file.   Brief Narrative:  HPI on 12/19/2016 by Dr. Lyda Perone Virginio Isidore is a 81 y.o. male with medical history significant of Parkinson's disease and dementia, prior stroke, orthostatic hypotension.  Patient sent in to ED by SNF after developing reportedly progressive lethargy and AMS over course of day.  LKW was last evening.  Got to point where he wouldn't wake up with sternal rub.  Assessment & Plan   Acute ischemic bilateral thalamic infarcts -L>R -Patient with decreased level of alertness -CT head showed left and smaller right thalamus hypodensity suggesting acute infarct -MRI brain showed subacute bilateral thalamic infarctions -MRA head severe stenosis for some motion artifact proximal PCA -Carotid Doppler Right-sided vessels unable to visualize due to positioning. One of 39% stenosis left ICA. Left vertebral artery demonstrates antegrade flow -Echocardiogram EF 60-65%, grade 1 diastolic dysfunction -LDL 94 and hemoglobin A1c 5.8 -Continue Coumadin once able to swallow, currently on IV heparin  -Patient on lovastatin 20 mg at home, currently held due to NPO status  Essential hypertension -Allowing for permissive hypertension  Hypothyroidism -Continue IV Synthroid  Parkinson's disease -Sinemet held  Paroxysmal atrial fibrillation -INR currently 1.99 -heparin started 10/3 (INR 1.84)  Diabetes mellitus, type II -Appears to be diet controlled at baseline -hemoglobin A1c 5.8  Chronic kidney disease, stage III -Creatinine currently 1.14, no prior labs for comparison   Goals of care -discussed with patient's children via phone, discussed feeding- panda vs PEG -Will consult palliative care -Currently DNR  DVT Prophylaxis  heparin  Code Status: DO NOT RESUSCITATE  Family Communication: None at bedside. Discussed with  patient's son and daughter via phone.  Disposition Plan: Admitted. Pending palliative care consult and further decisions regarding feeding tube.  Consultants Neurology  Procedures  Echocardiogram Carotid doppler  Antibiotics   Anti-infectives    None      Subjective:   Andrew Ochoa seen and examined today.  Currently nonverbal  Objective:   Vitals:   12/20/16 2158 12/21/16 0138 12/21/16 0532 12/21/16 0818  BP: (!) 164/89 (!) 176/81 (!) 153/78 (!) 141/91  Pulse: 85 77 82 93  Resp: Temp: 99.1 F (37.3 C) 99.1 F (37.3 C) 99.7 F (37.6 C) (!) 100.8 F (38.2 C)  TempSrc: Axillary Axillary Axillary Axillary  SpO2: 100% 100% 100% 100%  Weight:      Height:        Intake/Output Summary (Last 24 hours) at 12/21/16 1108 Last data filed at 12/21/16 8119  Gross per 24 hour  Intake           160.97 ml  Output             1650 ml  Net         -1489.03 ml   Filed Weights   12/19/16 1711  Weight: 83 kg (183 lb)   Exam  General: Well developed, ill-appearing, NAD  HEENT: NCAT, mucous membranes moist.   Cardiovascular: S1 S2 auscultated, RRR  Respiratory: Clear to auscultation bilaterally with equal chest rise  Abdomen: Soft, nontender, nondistended, + bowel sounds  Extremities: warm dry without cyanosis clubbing or edema  Neuro: Cannot assess   Data Reviewed: I have personally reviewed following labs and imaging studies  CBC:  Recent Labs Lab 12/18/16 2022 12/18/16 2044 12/20/16 0545 12/21/16 0717  WBC 9.1  --  8.7 6.1  NEUTROABS 6.2  --   --   --   HGB 14.6 15.0 12.0* 11.5*  HCT 42.1 44.0 37.0* 36.3*  MCV 101.0*  --  102.8* 101.1*  PLT 175  --  195 165   Basic Metabolic Panel:  Recent Labs Lab 12/18/16 2044 12/18/16 2236 12/21/16 0717  NA 141 143 138  K 6.3* 4.0 3.9  CL 104 106 106  CO2  --  28 25  GLUCOSE 137* 122* 79  BUN 38* 26* 18  CREATININE 1.50* 1.44* 1.14  CALCIUM  --  9.1 8.8*   GFR: Estimated Creatinine  Clearance: 56.9 mL/min (by C-G formula based on SCr of 1.14 mg/dL). Liver Function Tests:  Recent Labs Lab 12/18/16 2236  AST 20  ALT 24  ALKPHOS 83  BILITOT 0.7  PROT 7.2  ALBUMIN 3.7   No results for input(s): LIPASE, AMYLASE in the last 168 hours. No results for input(s): AMMONIA in the last 168 hours. Coagulation Profile:  Recent Labs Lab 12/18/16 2022 12/19/16 1626 12/20/16 0545 12/21/16 0717  INR 2.17 1.90 1.84 1.99   Cardiac Enzymes: No results for input(s): CKTOTAL, CKMB, CKMBINDEX, TROPONINI in the last 168 hours. BNP (last 3 results) No results for input(s): PROBNP in the last 8760 hours. HbA1C:  Recent Labs  12/20/16 1600  HGBA1C 5.8*   CBG: No results for input(s): GLUCAP in the last 168 hours. Lipid Profile:  Recent Labs  12/20/16 1600  CHOL 151  HDL 39*  LDLCALC 94  TRIG 92  CHOLHDL 3.9   Thyroid Function Tests: No results for input(s): TSH, T4TOTAL, FREET4, T3FREE, THYROIDAB in the last 72 hours. Anemia Panel: No results for input(s): VITAMINB12, FOLATE, FERRITIN, TIBC, IRON, RETICCTPCT in the last 72 hours. Urine analysis:    Component Value Date/Time   COLORURINE YELLOW 12/19/2016 0906   APPEARANCEUR HAZY (A) 12/19/2016 0906   LABSPEC 1.018 12/19/2016 0906   PHURINE 5.0 12/19/2016 0906   GLUCOSEU 50 (A) 12/19/2016 0906   HGBUR NEGATIVE 12/19/2016 0906   BILIRUBINUR NEGATIVE 12/19/2016 0906   KETONESUR NEGATIVE 12/19/2016 0906   PROTEINUR NEGATIVE 12/19/2016 0906   NITRITE NEGATIVE 12/19/2016 0906   LEUKOCYTESUR NEGATIVE 12/19/2016 0906   Sepsis Labs: (procalcitonin:4,lacticidven:4)  ) Recent Results (from the past 240 hour(s))  MRSA PCR Screening     Status: None   Collection Time: 12/19/16  6:26 AM  Result Value Ref Range Status   MRSA by PCR NEGATIVE NEGATIVE Final    Comment:        The GeneXpert MRSA Assay (FDA approved for NASAL specimens only), is one component of a comprehensive MRSA  colonization surveillance program. It is not intended to diagnose MRSA infection nor to guide or monitor treatment for MRSA infections.       Radiology Studies: No results found.   Scheduled Meds: . levothyroxine  25 mcg Intravenous Daily  . thiamine injection  100 mg Intravenous Daily   Continuous Infusions: . heparin 950 Units/hr (12/21/16 0909)     LOS: 2 days   Time Spent in minutes   30 minutes  Emmeline Winebarger D.O. on 12/21/2016 at 11:08 AM  Between 7am to 7pm - Pager - [phone]  After 7pm go to [email or web address] - password [email or web address]  And look for the night coverage person covering for me after hours  [facility] Office  [phone]

## 2016-12-21 NOTE — Progress Notes (Signed)
ANTICOAGULATION CONSULT NOTE - Follow Up Consult  Pharmacy Consult for warfarin/ heparin Indication: atrial fibrillation  Allergies  Allergen Reactions  . Neomycin-Bacitracin Zn-Polymyx Hives  . Benzalkonium Chloride Rash    Patient Measurements: Height: 5'11" Weight: 183 lbs  Heparin dosing weight: 78 Kg   Vital Signs: Temp: 100.8 F (38.2 C) (10/04 0818) Temp Source: Axillary (10/04 0818) BP: 141/91 (10/04 0818) Pulse Rate: 93 (10/04 0818)  Labs:  Recent Labs  12/18/16 2022 12/18/16 2044 12/18/16 2236 12/19/16 1626 12/20/16 0545 12/20/16 2108 12/21/16 0717  HGB 14.6 15.0  --   --  12.0*  --  11.5*  HCT 42.1 44.0  --   --  37.0*  --  36.3*  PLT 175  --   --   --  195  --  165  APTT 32  --   --   --   --   --   --   LABPROT 24.0*  --   --  21.7* 21.1*  --  22.5*  INR 2.17  --   --  1.90 1.84  --  1.99  HEPARINUNFRC  --   --   --   --   --  0.59 0.76*  CREATININE  --  1.50* 1.44*  --   --   --  1.14    Medical History: Past Medical History:  Diagnosis Date  . DM2 (diabetes mellitus, type 2) (HCC)   . Hypothyroidism   . Orthostatic hypotension   . PAF (paroxysmal atrial fibrillation) (HCC)   . Parkinson's disease (HCC)   . Stroke [facility])     Medications:  Scheduled:  . levothyroxine  25 mcg Intravenous Daily  . thiamine injection  100 mg Intravenous Daily    Assessment: 72 yom with history of dementia with Parkinson's disease who was found to have a bilateral thalamic stroke. He has a history of atrial fibrillation warfarin at home.   Heparin level slightly above goal this AM.  No overt bleeding or complications noted.  Goal of Therapy:  INR 2-3  Heparin level 0.3-0.7 Monitor platelets by anticoagulation protocol: Yes   Plan:  Decrease IV heparin to 950 units/hr. Recheck heparin level in 8 hrs. Hold warfarin while NPO Monitor daily heparin level, CBC, INR, and s/sx of bleeding   Tad Moore, BCPS  Clinical Pharmacist Pager [phone]  12/21/2016 8:58 AM

## 2016-12-21 NOTE — Progress Notes (Signed)
ANTICOAGULATION CONSULT NOTE - Follow Up Consult  Pharmacy Consult for warfarin/ heparin Indication: atrial fibrillation  Allergies  Allergen Reactions  . Neomycin-Bacitracin Zn-Polymyx Hives  . Benzalkonium Chloride Rash    Patient Measurements: Height: 5'11" Weight: 183 lbs  Heparin dosing weight: 78 Kg   Vital Signs: Temp: 99.6 F (37.6 C) (10/04 1617) Temp Source: Axillary (10/04 1617) BP: 154/87 (10/04 1617) Pulse Rate: 81 (10/04 1617)  Labs:  Recent Labs  12/18/16 2022 12/18/16 2044 12/18/16 2236 12/19/16 1626 12/20/16 0545 12/20/16 2108 12/21/16 0717 12/21/16 1834  HGB 14.6 15.0  --   --  12.0*  --  11.5*  --   HCT 42.1 44.0  --   --  37.0*  --  36.3*  --   PLT 175  --   --   --  195  --  165  --   APTT 32  --   --   --   --   --   --   --   LABPROT 24.0*  --   --  21.7* 21.1*  --  22.5*  --   INR 2.17  --   --  1.90 1.84  --  1.99  --   HEPARINUNFRC  --   --   --   --   --  0.59 0.76* 0.61  CREATININE  --  1.50* 1.44*  --   --   --  1.14  --     Medical History: Past Medical History:  Diagnosis Date  . DM2 (diabetes mellitus, type 2) (HCC)   . Hypothyroidism   . Orthostatic hypotension   . PAF (paroxysmal atrial fibrillation) (HCC)   . Parkinson's disease (HCC)   . Stroke [facility])     Medications:  Scheduled:  . levothyroxine  25 mcg Intravenous Daily  . thiamine injection  100 mg Intravenous Daily    Assessment: 18 yom with history of dementia with Parkinson's disease who was found to have a bilateral thalamic stroke. He has a history of atrial fibrillation warfarin at home.   Heparin level slightly above goal this AM. Goal range adjusted to 0.3-0.5 given recent CVA. Heparin level tonight is 0.61, still slightly above goal range. No issues noted by nursing. No signs/symptoms of bleeding noted.  Goal of Therapy:  INR 2-3  Heparin level 0.3-0.5 Monitor platelets by anticoagulation protocol: Yes   Plan:  Decrease IV heparin to 850  units/hr. Recheck heparin level in 8 hrs. Hold warfarin while NPO Monitor daily heparin level, CBC, INR, and s/sx of bleeding   Girard Cooter, PharmD Clinical Pharmacist  Phone: [phone]  12/21/2016 7:49 PM

## 2016-12-21 NOTE — Progress Notes (Signed)
  Speech Language Pathology Treatment: Dysphagia;Cognitive-Linquistic  Patient Details Name: Andrew Ochoa MRN: [id number] DOB: [birth year] Today's Date: 12/21/2016 Time: 8657-8469 SLP Time Calculation (min) (ACUTE ONLY): 10 min  Assessment / Plan / Recommendation Clinical Impression  Unfortunately, pt with no clinical change today from yesterday's initial assessments. Maintained eyes closed. Max tactile/verbal cues provided in effort to elicit responses.  Pt verbalized unintelligibly without apparent intention.  Oral care provided - no spontaneous swallow response observed.  No command following, no acting upon environment in any meaningful way.  Note palliative care consult is pending - will follow for GOC.   HPI HPI: 81 y.o. male with a history of atrial fibrillation, stroke, GERD, dementia with Parkinson's disease who presents with decreased mental status. MRI showing subacute bilateral thalamic infarcts.  Pt was apparently on soft diet PTA.       SLP Plan  Continue with current plan of care       Recommendations  Diet recommendations: NPO                Oral Care Recommendations: Oral care QID Follow up Recommendations: Skilled Nursing facility SLP Visit Diagnosis: Dysphagia, unspecified (R13.10) Plan: Continue with current plan of care       GO              Adeola Dennen L. Samson Fred[place], [place] CCC/SLP Pager [phone]   Blenda Mounts Laurice 12/21/2016, 1:58 PM

## 2016-12-22 DIAGNOSIS — R4182 Altered mental status, unspecified: Secondary | ICD-10-CM

## 2016-12-22 DIAGNOSIS — Z515 Encounter for palliative care: Secondary | ICD-10-CM

## 2016-12-22 LAB — CBC
HEMATOCRIT: 35.9 % — AB (ref 39.0–52.0)
Hemoglobin: 11.3 g/dL — ABNORMAL LOW (ref 13.0–17.0)
MCH: 32.1 pg (ref 26.0–34.0)
MCHC: 31.5 g/dL (ref 30.0–36.0)
MCV: 102 fL — ABNORMAL HIGH (ref 78.0–100.0)
PLATELETS: 167 10*3/uL (ref 150–400)
RBC: 3.52 MIL/uL — AB (ref 4.22–5.81)
RDW: 13.2 % (ref 11.5–15.5)
WBC: 7 10*3/uL (ref 4.0–10.5)

## 2016-12-22 LAB — PROTIME-INR
INR: 2.09
PROTHROMBIN TIME: 23.3 s — AB (ref 11.4–15.2)

## 2016-12-22 LAB — HEPARIN LEVEL (UNFRACTIONATED)
HEPARIN UNFRACTIONATED: 0.5 [IU]/mL (ref 0.30–0.70)
Heparin Unfractionated: 0.53 IU/mL (ref 0.30–0.70)

## 2016-12-22 MED ORDER — CARBIDOPA-LEVODOPA 25-100 MG PO TABS
1.0000 | ORAL_TABLET | Freq: Three times a day (TID) | ORAL | Status: DC
  Administered 2016-12-22 – 2016-12-23 (×6): 1 via ORAL
  Filled 2016-12-22 (×7): qty 1

## 2016-12-22 NOTE — Progress Notes (Signed)
PROGRESS NOTE    Andrew Ochoa  XLK:[id number] DOB: [birth year] DOA: 12/18/2016 PCP: System, Provider Not In   No chief complaint on file.   Brief Narrative:  HPI on 12/19/2016 by Dr. Lyda Perone Andrew Ochoa is a 81 y.o. male with medical history significant of Parkinson's disease and dementia, prior stroke, orthostatic hypotension.  Patient sent in to ED by SNF after developing reportedly progressive lethargy and AMS over course of day.  LKW was last evening.  Got to point where he wouldn't wake up with sternal rub.  Assessment & Plan   Acute ischemic bilateral thalamic infarcts -L>R -Patient with decreased level of alertness -CT head showed left and smaller right thalamus hypodensity suggesting acute infarct -MRI brain showed subacute bilateral thalamic infarctions -MRA head severe stenosis for some motion artifact proximal PCA -Carotid Doppler Right-sided vessels unable to visualize due to positioning. 1-39% stenosis left ICA. Left vertebral artery demonstrates antegrade flow -Echocardiogram EF 60-65%, grade 1 diastolic dysfunction -LDL 94 and hemoglobin A1c 5.8 -Continue Coumadin once able to swallow, currently on IV heparin  -patient more awake today -discussed with speech- will attempt to give patient home medications, especially his sinemet  -holding statin  Essential hypertension -Allowing for permissive hypertension  Hypothyroidism -Continue IV Synthroid  Parkinson's disease -Sinemet restarted today  Paroxysmal atrial fibrillation -INR currently 2.09 -heparin started 10/3 (INR 1.84)  Diabetes mellitus, type II -Appears to be diet controlled at baseline -hemoglobin A1c 5.8  Chronic kidney disease, stage III -Creatinine currently 1.14, no prior labs for comparison   Goals of care -discussed with patient's children via phone, discussed feeding- panda vs PEG -Currently DNR -palliative care consulted and appreciated  DVT Prophylaxis  heparin  Code  Status: DO NOT RESUSCITATE  Family Communication: None at bedside.   Disposition Plan: Admitted. Pending improvement. Likely discharge to SNF in 1-2 days  Consultants Neurology Palliative care  Procedures  Echocardiogram Carotid doppler  Antibiotics   Anti-infectives    None      Subjective:   [name] seen and examined today.  Eyes open and trying to speak.  Objective:   Vitals:   12/21/16 2126 12/22/16 0119 12/22/16 0545 12/22/16 0854  BP: 116/72 (!) 103/57 122/63 (!) 148/85  Pulse: 84 74 66 89  Resp: Temp: (!) 101.2 F (38.4 C) 98.9 F (37.2 C) 98.5 F (36.9 C) 98.9 F (37.2 C)  TempSrc: Oral Oral Oral Oral  SpO2: 97% 100% 100%   Weight:      Height:        Intake/Output Summary (Last 24 hours) at 12/22/16 1406 Last data filed at 12/22/16 0648  Gross per 24 hour  Intake                0 ml  Output              500 ml  Net             -500 ml   Filed Weights   12/19/16 1711  Weight: 83 kg (183 lb)   Exam  General: Well developed, ill appearing, NAD  HEENT: NCAT, mucous membranes moist.   Cardiovascular: S1 S2 auscultated, RRR, no rubs  Respiratory: Clear to auscultation bilaterally with equal chest rise  Abdomen: Soft, nontender, nondistended, + bowel sounds  Extremities: warm dry without cyanosis clubbing or edema  Neuro: Awake, follows some commands.    Data Reviewed: I have personally reviewed following labs and imaging studies  CBC:  Recent Labs Lab 12/18/16 2022 12/18/16 2044 12/20/16 0545 12/21/16 0717 12/22/16 0600  WBC 9.1  --  8.7 6.1 7.0  NEUTROABS 6.2  --   --   --   --   HGB 14.6 15.0 12.0* 11.5* 11.3*  HCT 42.1 44.0 37.0* 36.3* 35.9*  MCV 101.0*  --  102.8* 101.1* 102.0*  PLT 175  --  195 165 167   Basic Metabolic Panel:  Recent Labs Lab 12/18/16 2044 12/18/16 2236 12/21/16 0717  NA 141 143 138  K 6.3* 4.0 3.9  CL 104 106 106  CO2  --  28 25  GLUCOSE 137* 122* 79  BUN 38* 26* 18    CREATININE 1.50* 1.44* 1.14  CALCIUM  --  9.1 8.8*   GFR: Estimated Creatinine Clearance: 56.9 mL/min (by C-G formula based on SCr of 1.14 mg/dL). Liver Function Tests:  Recent Labs Lab 12/18/16 2236  AST 20  ALT 24  ALKPHOS 83  BILITOT 0.7  PROT 7.2  ALBUMIN 3.7   No results for input(s): LIPASE, AMYLASE in the last 168 hours. No results for input(s): AMMONIA in the last 168 hours. Coagulation Profile:  Recent Labs Lab 12/18/16 2022 12/19/16 1626 12/20/16 0545 12/21/16 0717 12/22/16 0600  INR 2.17 1.90 1.84 1.99 2.09   Cardiac Enzymes: No results for input(s): CKTOTAL, CKMB, CKMBINDEX, TROPONINI in the last 168 hours. BNP (last 3 results) No results for input(s): PROBNP in the last 8760 hours. HbA1C:  Recent Labs  12/20/16 1600  HGBA1C 5.8*   CBG: No results for input(s): GLUCAP in the last 168 hours. Lipid Profile:  Recent Labs  12/20/16 1600  CHOL 151  HDL 39*  LDLCALC 94  TRIG 92  CHOLHDL 3.9   Thyroid Function Tests: No results for input(s): TSH, T4TOTAL, FREET4, T3FREE, THYROIDAB in the last 72 hours. Anemia Panel: No results for input(s): VITAMINB12, FOLATE, FERRITIN, TIBC, IRON, RETICCTPCT in the last 72 hours. Urine analysis:    Component Value Date/Time   COLORURINE YELLOW 12/19/2016 0906   APPEARANCEUR HAZY (A) 12/19/2016 0906   LABSPEC 1.018 12/19/2016 0906   PHURINE 5.0 12/19/2016 0906   GLUCOSEU 50 (A) 12/19/2016 0906   HGBUR NEGATIVE 12/19/2016 0906   BILIRUBINUR NEGATIVE 12/19/2016 0906   KETONESUR NEGATIVE 12/19/2016 0906   PROTEINUR NEGATIVE 12/19/2016 0906   NITRITE NEGATIVE 12/19/2016 0906   LEUKOCYTESUR NEGATIVE 12/19/2016 0906   Sepsis Labs: (procalcitonin:4,lacticidven:4)  ) Recent Results (from the past 240 hour(s))  MRSA PCR Screening     Status: None   Collection Time: 12/19/16  6:26 AM  Result Value Ref Range Status   MRSA by PCR NEGATIVE NEGATIVE Final    Comment:        The GeneXpert MRSA Assay  (FDA approved for NASAL specimens only), is one component of a comprehensive MRSA colonization surveillance program. It is not intended to diagnose MRSA infection nor to guide or monitor treatment for MRSA infections.       Radiology Studies: No results found.   Scheduled Meds: . carbidopa-levodopa  1 tablet Oral TID  . levothyroxine  25 mcg Intravenous Daily  . thiamine injection  100 mg Intravenous Daily   Continuous Infusions: . heparin 850 Units/hr (12/22/16 1357)     LOS: 3 days   Time Spent in minutes   30 minutes  Ramina Hulet D.O. on 12/22/2016 at 2:06 PM  Between 7am to 7pm - Pager - [phone]  After 7pm go to [email or web address] - password [email or web address]  And look  for the night coverage person covering for me after hours  [facility] Office  [phone]

## 2016-12-22 NOTE — Progress Notes (Signed)
Physical Therapy Treatment Patient Details Name: Andrew Ochoa MRN: [id number] DOB: 1936-01-29 Today's Date: 12/22/2016    History of Present Illness 81 y.o.malewith a history of atrial fibrillation, stroke, dementia with Parkinson's disease who presents with decreased mental status. MRI showing subacute bilateral thalamus infarcts.    PT Comments    Pt progressing towards physical therapy goals. This session able to follow increased commands and maintained eyes open throughout. Appropriately gave thumbs up bilaterally, was able to wash face and hand washcloth back to therapist with his L hand, and followed instruction for LAQ exercise (2 sets of 5 bilaterally). Increased facilitation required for lateral lean onto elbow, and although was able to maintain lateral lean, was not able to initiate return to midline without assist. Will continue to follow and progress as able per POC.   Follow Up Recommendations  SNF;Supervision/Assistance - 24 hour     Equipment Recommendations  None recommended by PT (TBD by next venue of care)    Recommendations for Other Services       Precautions / Restrictions Precautions Precautions: Fall Restrictions Weight Bearing Restrictions: No    Mobility  Bed Mobility Overal bed mobility: Needs Assistance Bed Mobility: Supine to Sit;Sit to Supine     Supine to sit: Total assist;+2 for physical assistance;HOB elevated Sit to supine: Total assist;+2 for physical assistance   General bed mobility comments: Total A +2 for bed mobility. Use of pad to transition hips from supine to EOB  Transfers                 General transfer comment: Unable at this time. Pt is appropriate for use of maxi-move for OOB transfers.   Ambulation/Gait         Gait velocity: Decreased Gait velocity interpretation: Below normal speed for age/gender General Gait Details: Unable at this time.    Stairs            Wheelchair Mobility    Modified  Rankin (Stroke Patients Only)       Balance Overall balance assessment: Needs assistance Sitting-balance support: No upper extremity supported;Feet supported Sitting balance-Leahy Scale: Zero Sitting balance - Comments: total A to maintain sitting balance. lateral lean to R and posterior Postural control: Posterior lean                                  Cognition Arousal/Alertness: Lethargic Behavior During Therapy: Flat affect (non-verbal) Overall Cognitive Status: History of cognitive impairments - at baseline                                        Exercises      General Comments        Pertinent Vitals/Pain Pain Assessment: Faces Faces Pain Scale: No hurt Pain Intervention(s): Monitored during session    Home Living                      Prior Function            PT Goals (current goals can now be found in the care plan section) Acute Rehab PT Goals Patient Stated Goal: Not stated PT Goal Formulation: Patient unable to participate in goal setting Time For Goal Achievement: 01/02/17 Potential to Achieve Goals: Fair Progress towards PT goals: Progressing toward goals    Frequency  Min 2X/week      PT Plan Current plan remains appropriate    Co-evaluation              AM-PAC PT "6 Clicks" Daily Activity  Outcome Measure  Difficulty turning over in bed (including adjusting bedclothes, sheets and blankets)?: Unable Difficulty moving from lying on back to sitting on the side of the bed? : Unable Difficulty sitting down on and standing up from a chair with arms (e.g., wheelchair, bedside commode, etc,.)?: Unable Help needed moving to and from a bed to chair (including a wheelchair)?: Total Help needed walking in hospital room?: Total Help needed climbing 3-5 steps with a railing? : Total 6 Click Score: 6    End of Session Equipment Utilized During Treatment: Oxygen Activity Tolerance: Patient tolerated  treatment well Patient left: in bed;with call bell/phone within reach;with bed alarm set Nurse Communication: Mobility status PT Visit Diagnosis: Other symptoms and signs involving the nervous system (R29.898)     Time: 1610-9604 PT Time Calculation (min) (ACUTE ONLY): 37 min  Charges:  $Therapeutic Activity: 23-37 mins                    G Codes:       Conni Slipper, PT, DPT Acute Rehabilitation Services Pager: [phone]    Marylynn Pearson 12/22/2016, 2:53 PM

## 2016-12-22 NOTE — Progress Notes (Signed)
ANTICOAGULATION CONSULT NOTE - Follow Up Consult  Pharmacy Consult for warfarin/ heparin Indication: atrial fibrillation  Allergies  Allergen Reactions  . Neomycin-Bacitracin Zn-Polymyx Hives  . Benzalkonium Chloride Rash    Patient Measurements: Height: 5'11" Weight: 183 lbs  Heparin dosing weight: 78 Kg   Vital Signs: Temp: 98.5 F (36.9 C) (10/05 0545) Temp Source: Oral (10/05 0545) BP: 122/63 (10/05 0545) Pulse Rate: 66 (10/05 0545)  Labs:  Recent Labs  12/19/16 1626  12/20/16 0545  12/21/16 0717 12/21/16 1834 12/22/16 0600  HGB  --   < > 12.0*  --  11.5*  --  11.3*  HCT  --   --  37.0*  --  36.3*  --  35.9*  PLT  --   --  195  --  165  --  167  LABPROT 21.7*  --  21.1*  --  22.5*  --   --   INR 1.90  --  1.84  --  1.99  --   --   HEPARINUNFRC  --   --   --   < > 0.76* 0.61 0.50  CREATININE  --   --   --   --  1.14  --   --   < > = values in this interval not displayed.  Assessment: 68 yom with history of dementia with Parkinson's disease who was found to have a bilateral thalamic stroke. He has a history of atrial fibrillation warfarin at home.   Hep lvl 0.5 on 850 units/hr  Goal of Therapy:  INR 2-3  Heparin level 0.3-0.5 Monitor platelets by anticoagulation protocol: Yes   Plan:  continue heparin to 850 units/hr. Confirm level at 1200 Hold warfarin while NPO Monitor daily heparin level, CBC, INR, and s/sx of bleeding   Isaac Bliss, PharmD, BCPS, BCCCP Clinical Pharmacist Clinical phone for 12/22/2016 from 7a-3:30p: [phone] If after 3:30p, please call main pharmacy at: [phone] 12/22/2016 7:31 AM

## 2016-12-22 NOTE — Care Management Note (Signed)
Case Management Note  Patient Details  Name: Andrew Ochoa MRN: [id number] Date of Birth: 1935/06/24  Subjective/Objective:                    Action/Plan: Plan is for patient to return [facility] when medically stable. CM following.  Expected Discharge Date:                  Expected Discharge Plan:  Skilled Nursing Facility  In-House Referral:  Clinical Social Work  Discharge planning Services     Post Acute Care Choice:    Choice offered to:     DME Arranged:    DME Agency:     HH Arranged:    HH Agency:     Status of Service:  In process, will continue to follow  If discussed at Long Length of Stay Meetings, dates discussed:    Additional Comments:  Kermit Balo, RN 12/22/2016, 11:41 AM

## 2016-12-22 NOTE — Progress Notes (Signed)
ANTICOAGULATION CONSULT NOTE - Follow Up Consult  Pharmacy Consult for warfarin/ heparin Indication: atrial fibrillation  Allergies  Allergen Reactions  . Neomycin-Bacitracin Zn-Polymyx Hives  . Benzalkonium Chloride Rash    Patient Measurements: Height: 5'11" Weight: 183 lbs  Heparin dosing weight: 78 Kg   Vital Signs: Temp: 98.1 F (36.7 C) (10/05 1245) Temp Source: Oral (10/05 1245) BP: 123/72 (10/05 1245) Pulse Rate: 74 (10/05 1245)  Labs:  Recent Labs  12/20/16 0545  12/21/16 0717 12/21/16 1834 12/22/16 0600 12/22/16 1423  HGB 12.0*  --  11.5*  --  11.3*  --   HCT 37.0*  --  36.3*  --  35.9*  --   PLT 195  --  165  --  167  --   LABPROT 21.1*  --  22.5*  --  23.3*  --   INR 1.84  --  1.99  --  2.09  --   HEPARINUNFRC  --   < > 0.76* 0.61 0.50 0.53  CREATININE  --   --  1.14  --   --   --   < > = values in this interval not displayed.  Assessment: 6 yom with history of dementia with Parkinson's disease who was found to have a bilateral thalamic stroke. He has a history of atrial fibrillation on warfarin at home.   He is currently having issues with swallowing, so he is on IV heparin until warfarin can be restarted. Daily INRs were still being checked, INR this morning was 2.09- patient has NOT received warfarin since admission.  Heparin level slightly above goal this afternoon. No bleeding noted. CBC stable  Goal of Therapy:  INR 2-3  Heparin level 0.3-0.5 Monitor platelets by anticoagulation protocol: Yes   Plan:  Decrease IV heparin to 800 units/hr. Recheck heparin level in 8 hrs at midnight  Hold warfarin while NPO- protocol and daily INRs have been discontinued so there is no confusion when warfarin needs to be restarted. Pharmacy still following for restart Monitor daily heparin level, CBC, and s/sx of bleeding   Dwana Garin D. Mairead Schwarzkopf, PharmD, BCPS Clinical Pharmacist Pager: [phone] Clinical Phone for 12/22/2016 until 3:30pm: [phone] If after  3:30pm, please call main pharmacy at [phone] 12/22/2016 3:53 PM

## 2016-12-22 NOTE — Consult Note (Signed)
Consultation Note Date: 12/22/2016   Patient Name: Andrew Ochoa  DOB: 07/25/1935  MRN: [id number]  Age / Sex: 81 y.o., male  PCP: System, Provider Not In Referring Physician: Edsel Petrin, DO  Reason for Consultation: Establishing goals of care and Psychosocial/spiritual support  HPI/Patient Profile: 81 y.o. male  with past medical history of Parkinson's disease, diabetes, CVA, diastolic heart failure grade 1 admitted on 12/18/2016 with altered mental status from skilled nursing facility. He was found to have acute bilateral thalamic infarcts.  Consult was ordered for goals of care, with specific emphasis on artificial feeding.   Clinical Assessment and Goals of Care: Patient is nonverbal. I did observe him working with physical therapy this morning. He is comprehending and able to carry out some simple commands such as taking a washcloth on command ,wiping his face on command and handing the washcloth back; therapist asked for him to give her a "thumbs-up" and he was able to do that with his right hand. Speech language therapy's note reviewed and he was able to participate in eating and drinking and a diet of thickened liquids and pured food (which daughter shares she was on before at the nursing home) has been ordered  Carmie Kanner daughter [phone] is main contact. She lives in [place]. Pt has a son, who resides in [place]  Educated family on eating and drinking in the setting of advanced age, as well as illness. Reviewed with daughter, patient despite participating with a pured diet, is still high risk for aspiration pneumonia secondary to Parkinson's disease. She states she and her brother has struggled with the issue of feeding, and was worried that was suffering attached to that "starving her". I discussed that with an impaired immune system, in the setting of illness such as acute stroke,  bites and sips become a new normal. But the body overall, triages its resources to heart lung and brain versus the GI tract. She articulates that she has seen a sharp decline in her father over the past several months in terms of eating less and less verbal. 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    SUMMARY OF RECOMMENDATIONS   Confirmed DO NOT RESUSCITATE/DO NOT INTUBATE When medically maximized, return to [facility] in [facility] [place]. Family hopeful to use skilled days but recognizes that that will probably only be for very brief period of time Educated daughter on how to access hospice at that point through social work at the facility No feeding tube warranted at that this point. Did however recommend that daughter and son begin to have this discussion as this would likely be something that patient will experience in the very near future. Gave daughter some Internet resources to review in addition to information provided during goals of care discussion Code Status/Advance Care Planning:  DNR    Symptom Management:   Pain: Tylenol as needed  Constipation: Patient at high risk for constipation. Recommend senna  2 tablets daily; or milk of magnesia, lactulose  Palliative Prophylaxis:   Aspiration, Bowel Regimen, Delirium Protocol, Eye Care, Frequent Pain Assessment, Oral Care and Turn Reposition  Additional Recommendations (Limitations, Scope, Preferences):  Avoid Hospitalization, Minimize Medications, Initiate Comfort Feeding, No Chemotherapy, No Surgical Procedures and No Tracheostomy  Psycho-social/Spiritual:   Desire for further Chaplaincy support:no  Additional Recommendations: Referral to Community Resources   Prognosis:   < 6 months in the setting of advanced Parkinson's disease, new, acute bilateral thalamic infarcts; high risk for aspiration pneumonia, sepsis  Discharge  Planning: Skilled Nursing Facility for rehab with Palliative care service follow-up      Primary Diagnoses: Present on Admission: . 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 Hypothyroidism . PAF (paroxysmal atrial fibrillation) (HCC) . Parkinson's disease (HCC) . Dementia due to Parkinson's disease without behavioral disturbance (HCC)   I have reviewed the medical record, interviewed the patient and family, and examined the patient. The following aspects are pertinent.  Past Medical History:  Diagnosis Date  . DM2 (diabetes mellitus, type 2) (HCC)   . Hypothyroidism   . Orthostatic hypotension   . PAF (paroxysmal atrial fibrillation) (HCC)   . Parkinson's disease (HCC)   . Stroke [facility])    Social History   Social History  . Marital status: Married    Spouse name: N/A  . Number of children: N/A  . Years of education: N/A   Social History Main Topics  . Smoking status: Never Smoker  . Smokeless tobacco: None  . Alcohol use No  . Drug use: No  . Sexual activity: Not Asked   Other Topics Concern  . None   Social History Narrative  . None   Family History  Problem Relation Age of Onset  . Diabetes Father    Scheduled Meds: . carbidopa-levodopa  1 tablet Oral TID  . levothyroxine  25 mcg Intravenous Daily  . thiamine injection  100 mg Intravenous Daily   Continuous Infusions: . heparin 850 Units/hr (12/21/16 2006)   PRN Meds:.acetaminophen **OR** acetaminophen (TYLENOL) oral liquid 160 mg/5 mL **OR** acetaminophen Medications Prior to Admission:  Prior to Admission medications   Medication Sig Start Date End Date Taking? Authorizing Provider  amiodarone (PACERONE) 100 MG tablet Take 100 mg by mouth daily. 07/21/16  Yes [provider]  carbidopa-levodopa (SINEMET IR) 25-100 MG tablet Take 1 tablet by mouth 3 (three) times daily.   Yes [provider]  Cholecalciferol (VITAMIN D3) 1000 units CAPS Take 1,000 Units by mouth  daily.   Yes [provider]  Cinnamon 500 MG capsule Take 500 mg by mouth daily.   Yes [provider]  docusate sodium (COLACE) 100 MG capsule Take 100 mg by mouth daily.   Yes [provider]  ENSURE PLUS (ENSURE PLUS) LIQD Take 237 mLs by mouth 3 (three) times daily between meals.   Yes [provider]  fludrocortisone (FLORINEF) 0.1 MG tablet Take 0.1 mg by mouth daily.   Yes [provider]  levothyroxine (SYNTHROID, LEVOTHROID) 50 MCG tablet Take 50 mcg by mouth every morning. 12/11/16  Yes [provider]  lovastatin (MEVACOR) 20 MG tablet Take 20 mg by mouth every evening. 06/21/16  Yes [provider]  magnesium oxide (MAG-OX) 400 MG tablet Take 400 mg by mouth daily.   Yes [provider]  Multiple Vitamin (MULTIVITAMIN) capsule Take 1 capsule by mouth every morning.   Yes [provider]  pantoprazole (PROTONIX) 40 MG tablet Take 40  mg by mouth daily. 07/04/16  Yes [provider]  risperiDONE (RISPERDAL) 0.5 MG tablet Take 0.5 mg by mouth daily. 11/24/16  Yes [provider]  warfarin (COUMADIN) 2 MG tablet Take 2 mg by mouth daily. 12/05/16  Yes [provider]   Allergies  Allergen Reactions  . Neomycin-Bacitracin Zn-Polymyx Hives  . Benzalkonium Chloride Rash   Review of Systems  Unable to perform ROS: Patient nonverbal    Physical Exam  Constitutional:  Acutely ill older man. He is nonverbal, in no acute distress  Neck:  Neck flexed forward. Physical therapy unable to manually extend neck backwards or further side to side  Pulmonary/Chest: Effort normal.  Musculoskeletal:  Head flexed forward He is unable to maintain a seated position on the edge of the bed without assistance  Neurological:  He keeps his eyes closed, but he is beginning to follow simple commands such as when physical therapy asked them to give them a thumbs up he was able to do that; he was able to  take the washcloth as well as hand washcloth back. He is nonverbal and will not attempt to make yes or no gestures  Skin: Skin is warm and dry.  Psychiatric:  Unable to test  Nursing note and vitals reviewed.   Vital Signs: BP (!) 148/85 (BP Location: Left Arm)   Pulse 89   Temp 98.9 F (37.2 C) (Oral)   Resp 16   Ht  (1.803 m)   Wt 83 kg (183 lb)   SpO2 100%   BMI 25.52 kg/m  Pain Assessment: Faces       SpO2: SpO2: 100 % O2 Device:SpO2: 100 % O2 Flow Rate: .O2 Flow Rate (L/min): 2 L/min  IO: Intake/output summary:  Intake/Output Summary (Last 24 hours) at 12/22/16 1246 Last data filed at 12/22/16 1610  Gross per 24 hour  Intake                0 ml  Output              500 ml  Net             -500 ml    LBM: Last BM Date: 12/21/16 Baseline Weight: Weight: 83 kg (183 lb) Most recent weight: Weight: 83 kg (183 lb)     Palliative Assessment/Data:   Flowsheet Rows     Most Recent Value  Intake Tab  Referral Department  Hospitalist  Unit at Time of Referral  Med/Surg Unit  Palliative Care Primary Diagnosis  Neurology  Date Notified  12/21/16  Palliative Care Type  New Palliative care  Reason for referral  Clarify Goals of Care, Counsel Regarding Hospice  Date of Admission  12/18/16  Date first seen by Palliative Care  12/22/16  # of days Palliative referral response time  1 Day(s)  # of days IP prior to Palliative referral  3  Clinical Assessment  Palliative Performance Scale Score  30%  Pain Max last 24 hours  Not able to report  Pain Min Last 24 hours  Not able to report  Dyspnea Max Last 24 Hours  Not able to report  Dyspnea Min Last 24 hours  Not able to report  Nausea Max Last 24 Hours  Not able to report  Nausea Min Last 24 Hours  Not able to report  Anxiety Max Last 24 Hours  Not able to report  Anxiety Min Last 24 Hours  Not able to report  Other Max Last  24 Hours  Not able to report  Psychosocial & Spiritual Assessment  Palliative Care  Outcomes  Patient/Family meeting held?  Yes  Who was at the meeting?  dtr   Palliative Care Outcomes  Clarified goals of care      Time In: 1220 Time Out: 1300 Time Total: 70 min Greater than 50%  of this time was spent counseling and coordinating care related to the above assessment and plan.  Signed by: Irean Hong, NP   Please contact Palliative Medicine Team phone at [phone] for questions and concerns.  For individual provider: See Loretha Stapler

## 2016-12-22 NOTE — Progress Notes (Signed)
Left message for Palliative.

## 2016-12-22 NOTE — Progress Notes (Signed)
  Speech Language Pathology Treatment: Dysphagia;Cognitive-Linquistic  Patient Details Name: Andrew Ochoa MRN: [id number] DOB: 1935-06-21 Today's Date: 12/22/2016 Time: 4098-1191 SLP Time Calculation (min) (ACUTE ONLY): 27 min  Assessment / Plan / Recommendation Clinical Impression  Patient seen for follow up for dysphagia and cognitive-linguistic treatment. Pt initially with eyes closed, not responding to verbal or tactile cues. With repositioning and oral care, pt arouses and spontaneous swallows noted during oral care. Pt no discernable attempts to respond to basic Y/N questions, however SLP able to initiate correct articulatory movements for automatic speech tasks (numbers 1-3, days of the week) with max A verbal and tactile cues on pt's fingers for counting, though no phonation observed. With teaspoons of ice, water, pt demonstrates brief oral holding followed by suspected delayed swallow initiation, though no overt signs of aspiration noted. With moderate assistance, pt self feeds cup sips, which appears to improve automaticity and timing of swallow initiation, though pt has moderate R anterior loss. Cough with consecutive sips of thin liquids via straw, suggestive of reduced airway protection. Oral containment, manipulation functional for self-fed cup sips of honey thick liquids, clinician-fed teaspoons of purees. Swallow appears timely with no overt signs of aspiration. Recommend initiating supervised/assisted snacks of honey thick liquids, purees from floor stock when pt is alert, medications crushed in puree. No family present; reviewed recommendations with RN. Spoke with MD who has ordered pt's Sinemet; will follow for improvements over the weekend as able.     HPI HPI: 81 y.o. male with a history of atrial fibrillation, stroke, GERD, dementia with Parkinson's disease who presents with decreased mental status. MRI showing subacute bilateral thalamic infarcts.  Pt was apparently on soft  diet PTA.       SLP Plan  Continue with current plan of care       Recommendations  Diet recommendations: Other(comment) (small snacks puree/honey thick liquids from floor stock) Liquids provided via: Cup;No straw Medication Administration: Crushed with puree Supervision: Staff to assist with self feeding;Full supervision/cueing for compensatory strategies Compensations: Slow rate;Small sips/bites Postural Changes and/or Swallow Maneuvers: Seated upright 90 degrees                Oral Care Recommendations: Oral care QID Follow up Recommendations: Skilled Nursing facility SLP Visit Diagnosis: Dysphagia, unspecified (R13.10) Plan: Continue with current plan of care       GO              Andrew Baton, MS, CCC-SLP Speech-Language Pathologist [phone]  Arlana Lindau 12/22/2016, 10:20 AM

## 2016-12-23 LAB — HEPARIN LEVEL (UNFRACTIONATED)
HEPARIN UNFRACTIONATED: 0.25 [IU]/mL — AB (ref 0.30–0.70)
HEPARIN UNFRACTIONATED: 0.42 [IU]/mL (ref 0.30–0.70)
Heparin Unfractionated: 0.45 IU/mL (ref 0.30–0.70)

## 2016-12-23 LAB — CBC
HCT: 36 % — ABNORMAL LOW (ref 39.0–52.0)
HEMOGLOBIN: 11.3 g/dL — AB (ref 13.0–17.0)
MCH: 31.9 pg (ref 26.0–34.0)
MCHC: 31.4 g/dL (ref 30.0–36.0)
MCV: 101.7 fL — ABNORMAL HIGH (ref 78.0–100.0)
Platelets: 147 10*3/uL — ABNORMAL LOW (ref 150–400)
RBC: 3.54 MIL/uL — AB (ref 4.22–5.81)
RDW: 12.9 % (ref 11.5–15.5)
WBC: 6.5 10*3/uL (ref 4.0–10.5)

## 2016-12-23 LAB — PROTIME-INR
INR: 2.07
Prothrombin Time: 23.1 seconds — ABNORMAL HIGH (ref 11.4–15.2)

## 2016-12-23 MED ORDER — WARFARIN SODIUM 1 MG PO TABS
1.0000 mg | ORAL_TABLET | Freq: Once | ORAL | Status: AC
  Administered 2016-12-23: 1 mg via ORAL
  Filled 2016-12-23: qty 1

## 2016-12-23 MED ORDER — WARFARIN - PHARMACIST DOSING INPATIENT: Freq: Every day | Status: DC

## 2016-12-23 NOTE — Progress Notes (Signed)
  Speech Language Pathology Treatment: Dysphagia  Patient Details Name: Harjas Biggins MRN: [id number] DOB: 1935-05-18 Today's Date: 12/23/2016 Time: 7829-5621 SLP Time Calculation (min) (ACUTE ONLY): 15 min  Assessment / Plan / Recommendation Clinical Impression  Patient seen to address dysphagia goals and check for PO readiness beyond current restrictions (snacks of puree solids and honey thick liquids but no meal trays). Patient was asleep but aroused and awoke with only verbal and tactile cues. His alertness improved as session progressed and he exhibited improved awareness to PO's and mild improvement in actively responding to PO boluses. SLP gave patient spoon bites of puree and spoon sips of honey thick liquids. Patient did not exhibit any overt s/s of aspiration or penetration, but did exhibit delayed swallow initiation, with lingual and laryngeal pumping during initial 2-3 PO's. He did demonstrate improvement in swallow initiation, bolus manipulation and transit after approximately 8-10 spoon sips of honey thick liquids. RN came in room towards end of session and informed SLP that patient had had an entire cup of honey thick juice earlier this morning (4 ounces).   HPI HPI: 81 y.o. male with a history of atrial fibrillation, stroke, GERD, dementia with Parkinson's disease who presents with decreased mental status. MRI showing subacute bilateral thalamic infarcts.  Pt was apparently on soft diet PTA.       SLP Plan  Continue with current plan of care       Recommendations  Diet recommendations: Other(comment) ((small snacks: puree and honey thick liquids from floor stock)) Liquids provided via: Cup;No straw;Teaspoon Medication Administration: Crushed with puree Supervision: Staff to assist with self feeding;Full supervision/cueing for compensatory strategies Compensations: Slow rate;Small sips/bites Postural Changes and/or Swallow Maneuvers: Seated upright 90 degrees                 Oral Care Recommendations: Oral care QID Follow up Recommendations: Skilled Nursing facility SLP Visit Diagnosis: Dysphagia, unspecified (R13.10) Plan: Continue with current plan of care       GO               Angela Nevin, MA, CCC-SLP 12/23/16 12:31 PM

## 2016-12-23 NOTE — Progress Notes (Addendum)
ANTICOAGULATION CONSULT NOTE - Follow Up Consult  Pharmacy Consult for warfarin/heparin Indication: atrial fibrillation  Allergies  Allergen Reactions  . Neomycin-Bacitracin Zn-Polymyx Hives  . Benzalkonium Chloride Rash    Patient Measurements: Height: 5'11" Weight: 183 lbs  Heparin dosing weight: 78 Kg   Vital Signs: Temp: 99.3 F (37.4 C) (10/06 0524) Temp Source: Axillary (10/06 0524) BP: 109/81 (10/06 0524) Pulse Rate: 78 (10/06 0524)  Labs:  Recent Labs  12/21/16 0717  12/22/16 0600 12/22/16 1423 12/23/16 0019 12/23/16 0741  HGB 11.5*  --  11.3*  --   --  11.3*  HCT 36.3*  --  35.9*  --   --  36.0*  PLT 165  --  167  --   --  147*  LABPROT 22.5*  --  23.3*  --   --   --   INR 1.99  --  2.09  --   --   --   HEPARINUNFRC 0.76*  < > 0.50 0.53 0.42 0.25*  CREATININE 1.14  --   --   --   --   --   < > = values in this interval not displayed.  Assessment: 66 yom with history of dementia with Parkinson's disease who was found to have a bilateral thalamic stroke. He has a history of atrial fibrillation on warfarin at home.   He was having issues with swallowing, so warfarin was held and and IV heparin was started. Today, speech stated that he can take po meds if they're crushed. Pharmacy was consulted to dose warfarin. Patient has not received warfarin since 9/30. INR on 10/5 was 2.09 and today 2.07. Currently on IV levothyroxine.   Heparin level subtherapeutic at 0.25. RN confirms no interruptions to therapy, no line issues. No s/s bleeding documented.   Goal of Therapy:  INR 2-3  Heparin level 0.3-0.5 (recent stroke) Monitor platelets by anticoagulation protocol: Yes   Plan:  Increase heparin at 850 units/hr Warfarin 1 mg po x 1 tonight Confirm heparin level in 8 hrs Daily heparin level, INR and CBC Monitor for s/s bleeding  Adline Potter, PharmD Pharmacy Resident Pager: [phone]

## 2016-12-23 NOTE — Progress Notes (Signed)
ANTICOAGULATION CONSULT NOTE - Follow Up Consult  Pharmacy Consult for warfarin/heparin Indication: atrial fibrillation  Allergies  Allergen Reactions  . Neomycin-Bacitracin Zn-Polymyx Hives  . Benzalkonium Chloride Rash    Patient Measurements: Height: 5'11" Weight: 183 lbs  Heparin dosing weight: 78 Kg   Vital Signs: Temp: 99.1 F (37.3 C) (10/06 0046) Temp Source: Axillary (10/06 0046) BP: 137/78 (10/06 0046) Pulse Rate: 74 (10/06 0046)  Labs:  Recent Labs  12/20/16 0545  12/21/16 0717  12/22/16 0600 12/22/16 1423 12/23/16 0019  HGB 12.0*  --  11.5*  --  11.3*  --   --   HCT 37.0*  --  36.3*  --  35.9*  --   --   PLT 195  --  165  --  167  --   --   LABPROT 21.1*  --  22.5*  --  23.3*  --   --   INR 1.84  --  1.99  --  2.09  --   --   HEPARINUNFRC  --   < > 0.76*  < > 0.50 0.53 0.42  CREATININE  --   --  1.14  --   --   --   --   < > = values in this interval not displayed.  Assessment: 89 yom with history of dementia with Parkinson's disease who was found to have a bilateral thalamic stroke. He has a history of atrial fibrillation on warfarin at home.   He is currently having issues with swallowing, so he is on IV heparin until warfarin can be restarted. Daily INRs were still being checked, INR this morning was 2.09- patient has not received warfarin since admission.  Heparin level therapeutic at 0.42 for low goal. No s/s bleeding documented.   Goal of Therapy:  INR 2-3  Heparin level 0.3-0.5 Monitor platelets by anticoagulation protocol: Yes   Plan:  Continue heparin at 800 units/hr Confirm heparin level in 8 hrs Daily heparin level and CBC Monitor for s/s bleeding  *Holding warfarin while unable to take PO - protocol and daily INRs have been discontinued so there is no confusion when warfarin needs to be restarted. Pharmacy still following for restart.   York Cerise, PharmD Clinical Pharmacist 12/23/16 12:55 AM

## 2016-12-23 NOTE — Progress Notes (Signed)
ANTICOAGULATION CONSULT NOTE - Follow Up Consult  Pharmacy Consult for warfarin/heparin Indication: atrial fibrillation  Allergies  Allergen Reactions  . Neomycin-Bacitracin Zn-Polymyx Hives  . Benzalkonium Chloride Rash    Patient Measurements: Height: 5'11" Weight: 183 lbs  Heparin dosing weight: 78 Kg   Vital Signs: Temp: 97.8 F (36.6 C) (10/06 1741) Temp Source: Oral (10/06 1741) BP: 137/74 (10/06 1741) Pulse Rate: 78 (10/06 1741)  Labs:  Recent Labs  12/21/16 0717  12/22/16 0600  12/23/16 0019 12/23/16 0741 12/23/16 1404 12/23/16 1942  HGB 11.5*  --  11.3*  --   --  11.3*  --   --   HCT 36.3*  --  35.9*  --   --  36.0*  --   --   PLT 165  --  167  --   --  147*  --   --   LABPROT 22.5*  --  23.3*  --   --   --  23.1*  --   INR 1.99  --  2.09  --   --   --  2.07  --   HEPARINUNFRC 0.76*  < > 0.50  < > 0.42 0.25*  --  0.45  CREATININE 1.14  --   --   --   --   --   --   --   < > = values in this interval not displayed.  Assessment: 13 yom with history of dementia with Parkinson's disease who was found to have a bilateral thalamic stroke. He has a history of atrial fibrillation on warfarin at home.  -heparin level is 0.45    Goal of Therapy:  INR 2-3  Heparin level 0.3-0.5 Monitor platelets by anticoagulation protocol: Yes   Plan:  -No heparin changes needed -Daily heparin level and CBC  Harland German, Pharm D 12/23/2016 8:20 PM

## 2016-12-23 NOTE — Progress Notes (Signed)
PROGRESS NOTE    Andrew Ochoa  UEA:[id number] DOB: [birth year] DOA: 12/18/2016 PCP: System, Provider Not In   No chief complaint on file.   Brief Narrative:  HPI on 12/19/2016 by Dr. Lyda Perone Andrew Ochoa is a 81 y.o. male with medical history significant of Parkinson's disease and dementia, prior stroke, orthostatic hypotension.  Patient sent in to ED by SNF after developing reportedly progressive lethargy and AMS over course of day.  LKW was last evening.  Got to point where he wouldn't wake up with sternal rub.  Assessment & Plan   Acute ischemic bilateral thalamic infarcts -L>R -Patient with decreased level of alertness -CT head showed left and smaller right thalamus hypodensity suggesting acute infarct -MRI brain showed subacute bilateral thalamic infarctions -MRA head severe stenosis for some motion artifact proximal PCA -Carotid Doppler Right-sided vessels unable to visualize due to positioning. 1-39% stenosis left ICA. Left vertebral artery demonstrates antegrade flow -Echocardiogram EF 60-65%, grade 1 diastolic dysfunction -LDL 94 and hemoglobin A1c 5.8 -will restart coumadin tonight  -discussed with speech- small snacks, crush meds -holding statin -PT rec SNF  Essential hypertension -Allowing for permissive hypertension  Hypothyroidism -Continue IV Synthroid  Parkinson's disease -Continue Sinemet   Paroxysmal atrial fibrillation -INR 2.09 (10/5), will obtain repeat INR -heparin started 10/3 (INR 1.84) -Discussed with pharmacy, coumadin can be crushed -will restart coumadin per pharmacy tonight  Diabetes mellitus, type II -Appears to be diet controlled at baseline -hemoglobin A1c 5.8  Chronic kidney disease, stage III -Creatinine currently 1.14, no prior labs for comparison   Goals of care -discussed with patient's children via phone, discussed feeding- panda vs PEG -Currently DNR -palliative care consulted and appreciated  DVT Prophylaxis   heparin  Code Status: DO NOT RESUSCITATE  Family Communication: None at bedside.   Disposition Plan: Admitted. Pending improvement. Likely discharge to SNF in 1-2 days  Consultants Neurology Palliative care  Procedures  Echocardiogram Carotid doppler  Antibiotics   Anti-infectives    None      Subjective:   [name] seen and examined today.  Not very interactive today.  Objective:   Vitals:   12/22/16 2127 12/23/16 0046 12/23/16 0524 12/23/16 1058  BP: 127/82 137/78 109/81 135/81  Pulse: 78 74 78 74  Resp: Temp: 99 F (37.2 C) 99.1 F (37.3 C) 99.3 F (37.4 C) 97.7 F (36.5 C)  TempSrc: Axillary Axillary Axillary Axillary  SpO2: 100% 98% 92% 99%  Weight:      Height:        Intake/Output Summary (Last 24 hours) at 12/23/16 1337 Last data filed at 12/23/16 0900  Gross per 24 hour  Intake           361.03 ml  Output              175 ml  Net           186.03 ml   Filed Weights   12/19/16 1711  Weight: 83 kg (183 lb)   Exam  General: Well developed, chronically ill appearing, NAD  HEENT: NCAT, mucous membranes dry  Cardiovascular: S1 S2 auscultated, RRR  Respiratory: Clear to auscultation bilaterally with equal chest rise  Abdomen: Soft, nontender, nondistended, + bowel sounds  Extremities: warm dry without cyanosis clubbing or edema  Data Reviewed: I have personally reviewed following labs and imaging studies  CBC:  Recent Labs Lab 12/18/16 2022 12/18/16 2044 12/20/16 0545 12/21/16 0717 12/22/16 0600 12/23/16 0741  WBC 9.1  --  8.7 6.1 7.0 6.5  NEUTROABS 6.2  --   --   --   --   --   HGB 14.6 15.0 12.0* 11.5* 11.3* 11.3*  HCT 42.1 44.0 37.0* 36.3* 35.9* 36.0*  MCV 101.0*  --  102.8* 101.1* 102.0* 101.7*  PLT 175  --  195 165 167 147*   Basic Metabolic Panel:  Recent Labs Lab 12/18/16 2044 12/18/16 2236 12/21/16 0717  NA 141 143 138  K 6.3* 4.0 3.9  CL 104 106 106  CO2  --  28 25  GLUCOSE 137* 122* 79    BUN 38* 26* 18  CREATININE 1.50* 1.44* 1.14  CALCIUM  --  9.1 8.8*   GFR: Estimated Creatinine Clearance: 56.9 mL/min (by C-G formula based on SCr of 1.14 mg/dL). Liver Function Tests:  Recent Labs Lab 12/18/16 2236  AST 20  ALT 24  ALKPHOS 83  BILITOT 0.7  PROT 7.2  ALBUMIN 3.7   No results for input(s): LIPASE, AMYLASE in the last 168 hours. No results for input(s): AMMONIA in the last 168 hours. Coagulation Profile:  Recent Labs Lab 12/18/16 2022 12/19/16 1626 12/20/16 0545 12/21/16 0717 12/22/16 0600  INR 2.17 1.90 1.84 1.99 2.09   Cardiac Enzymes: No results for input(s): CKTOTAL, CKMB, CKMBINDEX, TROPONINI in the last 168 hours. BNP (last 3 results) No results for input(s): PROBNP in the last 8760 hours. HbA1C:  Recent Labs  12/20/16 1600  HGBA1C 5.8*   CBG: No results for input(s): GLUCAP in the last 168 hours. Lipid Profile:  Recent Labs  12/20/16 1600  CHOL 151  HDL 39*  LDLCALC 94  TRIG 92  CHOLHDL 3.9   Thyroid Function Tests: No results for input(s): TSH, T4TOTAL, FREET4, T3FREE, THYROIDAB in the last 72 hours. Anemia Panel: No results for input(s): VITAMINB12, FOLATE, FERRITIN, TIBC, IRON, RETICCTPCT in the last 72 hours. Urine analysis:    Component Value Date/Time   COLORURINE YELLOW 12/19/2016 0906   APPEARANCEUR HAZY (A) 12/19/2016 0906   LABSPEC 1.018 12/19/2016 0906   PHURINE 5.0 12/19/2016 0906   GLUCOSEU 50 (A) 12/19/2016 0906   HGBUR NEGATIVE 12/19/2016 0906   BILIRUBINUR NEGATIVE 12/19/2016 0906   KETONESUR NEGATIVE 12/19/2016 0906   PROTEINUR NEGATIVE 12/19/2016 0906   NITRITE NEGATIVE 12/19/2016 0906   LEUKOCYTESUR NEGATIVE 12/19/2016 0906   Sepsis Labs: (procalcitonin:4,lacticidven:4)  ) Recent Results (from the past 240 hour(s))  MRSA PCR Screening     Status: None   Collection Time: 12/19/16  6:26 AM  Result Value Ref Range Status   MRSA by PCR NEGATIVE NEGATIVE Final    Comment:        The  GeneXpert MRSA Assay (FDA approved for NASAL specimens only), is one component of a comprehensive MRSA colonization surveillance program. It is not intended to diagnose MRSA infection nor to guide or monitor treatment for MRSA infections.       Radiology Studies: No results found.   Scheduled Meds: . carbidopa-levodopa  1 tablet Oral TID  . levothyroxine  25 mcg Intravenous Daily  . thiamine injection  100 mg Intravenous Daily   Continuous Infusions: . heparin 850 Units/hr (12/23/16 1134)     LOS: 4 days   Time Spent in minutes   30 minutes  Andris Brothers D.O. on 12/23/2016 at 1:37 PM  Between 7am to 7pm - Pager - [phone]  After 7pm go to [email or web address] - password [email or web address]  And look for the night coverage person covering for me after hours  [place]  [phone]

## 2016-12-24 LAB — PROTIME-INR
INR: 2.1
Prothrombin Time: 23.4 seconds — ABNORMAL HIGH (ref 11.4–15.2)

## 2016-12-24 LAB — CBC
HEMATOCRIT: 37.2 % — AB (ref 39.0–52.0)
Hemoglobin: 11.8 g/dL — ABNORMAL LOW (ref 13.0–17.0)
MCH: 32.1 pg (ref 26.0–34.0)
MCHC: 31.7 g/dL (ref 30.0–36.0)
MCV: 101.1 fL — AB (ref 78.0–100.0)
PLATELETS: 167 10*3/uL (ref 150–400)
RBC: 3.68 MIL/uL — ABNORMAL LOW (ref 4.22–5.81)
RDW: 13.2 % (ref 11.5–15.5)
WBC: 6.5 10*3/uL (ref 4.0–10.5)

## 2016-12-24 LAB — BASIC METABOLIC PANEL
Anion gap: 10 (ref 5–15)
BUN: 14 mg/dL (ref 6–20)
CALCIUM: 8.8 mg/dL — AB (ref 8.9–10.3)
CHLORIDE: 103 mmol/L (ref 101–111)
CO2: 27 mmol/L (ref 22–32)
CREATININE: 1.02 mg/dL (ref 0.61–1.24)
GFR calc non Af Amer: >60 mL/min (ref >60)
GLUCOSE: 88 mg/dL (ref 65–99)
Potassium: 3.3 mmol/L — ABNORMAL LOW (ref 3.5–5.1)
Sodium: 140 mmol/L (ref 135–145)

## 2016-12-24 LAB — HEPARIN LEVEL (UNFRACTIONATED): Heparin Unfractionated: 0.33 IU/mL (ref 0.30–0.70)

## 2016-12-24 MED ORDER — FLUDROCORTISONE ACETATE 0.1 MG PO TABS: 0.1000 mg | ORAL_TABLET | Freq: Every day | ORAL | Status: AC | PRN

## 2016-12-24 MED ORDER — AMIODARONE HCL 200 MG PO TABS
100.0000 mg | ORAL_TABLET | Freq: Every day | ORAL | Status: DC
  Administered 2016-12-24: 100 mg via ORAL
  Filled 2016-12-24: qty 1

## 2016-12-24 MED ORDER — POTASSIUM CHLORIDE CRYS ER 20 MEQ PO TBCR: 40.0000 meq | EXTENDED_RELEASE_TABLET | Freq: Once | ORAL | Status: DC

## 2016-12-24 MED ORDER — PRAVASTATIN SODIUM 20 MG PO TABS: 20.0000 mg | ORAL_TABLET | Freq: Every day | ORAL | Status: DC

## 2016-12-24 MED ORDER — POTASSIUM CHLORIDE 20 MEQ/15ML (10%) PO SOLN
40.0000 meq | Freq: Once | ORAL | Status: AC
  Administered 2016-12-24: 40 meq via ORAL
  Filled 2016-12-24: qty 30

## 2016-12-24 MED ORDER — LEVOTHYROXINE SODIUM 50 MCG PO TABS: 50.0000 ug | ORAL_TABLET | Freq: Every day | ORAL | Status: DC

## 2016-12-24 MED ORDER — WARFARIN SODIUM 2 MG PO TABS
2.0000 mg | ORAL_TABLET | Freq: Once | ORAL | Status: DC
  Filled 2016-12-24: qty 1

## 2016-12-24 NOTE — Progress Notes (Signed)
Patient left unit by [facility] via stretcher.

## 2016-12-24 NOTE — Progress Notes (Signed)
  Speech Language Pathology Treatment: Dysphagia  Patient Details Name: Daqwan Dougal MRN: [id number] DOB: 06/03/1935 Today's Date: 12/24/2016 Time: 5621-3086 SLP Time Calculation (min) (ACUTE ONLY): 21 min  Assessment / Plan / Recommendation Clinical Impression  Pt's son present today.  Pt opened eyes, became more alert during oral care, then intermittently arousable for duration of session.  With max verbal/tactile cues, pt accepted six tspns of honey-thick liquid, needing physical assist with lip closure and cues to swallow.  Maintained eyes closed.  Coughing observed last several trials.  Residue removed from mouth with oral suctioning and feeding ceased. Pt's son had questions re: Parkinson's, stroke and overall impact on swallow.  We reviewed the nature of the swallowing deficits and pt's son said he spoke with his sister re: the Palliative care consult.  Recommend continuing purees, honey-thick liquids when pt is alert and an active participant; hold POs if pt is uncomfortable or demonstrating obvious s/s of aspiration.  SLP will follow.   HPI HPI: 81 y.o. male with a history of atrial fibrillation, stroke, GERD, dementia with Parkinson's disease who presents with decreased mental status. MRI showing subacute bilateral thalamic infarcts.  Pt was apparently on soft diet PTA.       SLP Plan  Continue with current plan of care       Recommendations  Diet recommendations: Other(comment) (puree and honey thick liquids from floor stock) Liquids provided via: Cup;No straw;Teaspoon Medication Administration: Crushed with puree Supervision: Staff to assist with self feeding;Full supervision/cueing for compensatory strategies Compensations: Slow rate;Small sips/bites Postural Changes and/or Swallow Maneuvers: Seated upright 90 degrees                Oral Care Recommendations: Oral care QID Follow up Recommendations: Skilled Nursing facility SLP Visit Diagnosis: Dysphagia,  unspecified (R13.10) Plan: Continue with current plan of care       GO                Blenda Mounts Laurice 12/24/2016, 11:32 AM

## 2016-12-24 NOTE — Discharge Summary (Addendum)
Physician Discharge Summary  Draper Gallon ZOX:[id number] DOB: June 26, 1935 DOA: 12/18/2016  PCP: System, Provider Not In  Admit date: 12/18/2016 Discharge date: 12/24/2016  Time spent: 45 minutes  Recommendations for Outpatient Follow-up:  Patient will be discharged to skilled nursing facility, continue therapy as possible.  Hospice to follow patient at the nursing home. Patient will need to follow up with primary care provider within one week of discharge, repeat BMP.  Patient should continue medications as prescribed.  Patient should have pureed food/honey thick liquids.   Discharge Diagnoses:  Acute ischemic bilateral thalamic infarcts Essential hypertension Hypothyroidism Parkinson's disease Paroxysmal atrial fibrillation Diabetes mellitus, type II Chronic kidney disease, stage III Goals of care  Discharge Condition: Stable  Diet recommendation: puree/honey thick liquids  Filed Weights   12/19/16 1711  Weight: 83 kg (183 lb)    History of present illness:  on 12/19/2016 by Dr. Mercy Riding Livengoodis a 81 y.o.malewith medical history significant of Parkinson's disease and dementia, prior stroke, orthostatic hypotension. Patient sent in to ED by SNF after developing reportedly progressive lethargy and AMS over course of day. LKW was last evening. Got to point where he wouldn't wake up with sternal rub.  Hospital Course:  Acute ischemic bilateral thalamic infarcts -L>R -Patient with decreased level of alertness -CT head showed left and smaller right thalamus hypodensity suggesting acute infarct -MRI brain showed subacute bilateral thalamic infarctions -MRA head severe stenosis for some motion artifact proximal PCA -Carotid Doppler Right-sided vessels unable to visualize due to positioning. 1-39% stenosis left ICA. Left vertebral artery demonstrates antegrade flow -Echocardiogram EF 60-65%, grade 1 diastolic dysfunction -LDL 94 and hemoglobin A1c  5.8 -discussed with speech- small snacks, crush meds -PT rec SNF -Continue coumadin and statin  Essential hypertension -Allowing for permissive hypertension  Hypothyroidism -Continue Synthroid  Parkinson's disease -Continue Sinemet   Paroxysmal atrial fibrillation -INR 2.09 (10/5), will obtain repeat INR -heparin started 10/3 (INR 1.84) -Discussed with pharmacy, coumadin can be crushed -will restart coumadin per pharmacy tonight  Diabetes mellitus, type II -Appears to be diet controlled at baseline -hemoglobin A1c 5.8  Chronic kidney disease, stage III -Creatinine currently 1.02, no prior labs for comparison   Hypokalemia -replaced, repeat BMP in one week  Goals of care -discussed with patient's children via phone, discussed feeding- panda vs PEG -Currently DNR -palliative care consulted and appreciated- did not feel PANDA/PEG indicated in this patient  Consultants Neurology Palliative care  Procedures  Echocardiogram Carotid doppler  Discharge Exam: Vitals:   12/24/16 0900 12/24/16 1353  BP: (!) 148/74 132/71  Pulse: 64 73  Resp: 16 18  Temp: 98.1 F (36.7 C) 98.4 F (36.9 C)  SpO2: 100% 98%    General: Well developed, chronically-ill appearing, NAD  HEENT: NCAT,  mucous membranes moist.  Cardiovascular: S1 S2 auscultated, RRR  Respiratory: Clear to auscultation bilaterally with equal chest rise  Abdomen: Soft, nontender, nondistended, + bowel sounds  Extremities: warm dry without cyanosis clubbing or edema  Discharge Instructions Discharge Instructions    Discharge instructions    Complete by:  As directed    Patient will be discharged to skilled nursing facility, continue therapy as possible.  Patient will need to follow up with primary care provider within one week of discharge, repeat BMP.  Patient should continue medications as prescribed.  Patient should have pureed food/honey thick liquids.     Current Discharge Medication List     CONTINUE these medications which have CHANGED   Details  fludrocortisone (FLORINEF) 0.1  MG tablet Take 1 tablet (0.1 mg total) by mouth daily as needed (hypotension).      CONTINUE these medications which have NOT CHANGED   Details  amiodarone (PACERONE) 100 MG tablet Take 100 mg by mouth daily.    carbidopa-levodopa (SINEMET IR) 25-100 MG tablet Take 1 tablet by mouth 3 (three) times daily.    Cholecalciferol (VITAMIN D3) 1000 units CAPS Take 1,000 Units by mouth daily.    Cinnamon 500 MG capsule Take 500 mg by mouth daily.    docusate sodium (COLACE) 100 MG capsule Take 100 mg by mouth daily.    ENSURE PLUS (ENSURE PLUS) LIQD Take 237 mLs by mouth 3 (three) times daily between meals.    levothyroxine (SYNTHROID, LEVOTHROID) 50 MCG tablet Take 50 mcg by mouth every morning.    lovastatin (MEVACOR) 20 MG tablet Take 20 mg by mouth every evening.    magnesium oxide (MAG-OX) 400 MG tablet Take 400 mg by mouth daily.    Multiple Vitamin (MULTIVITAMIN) capsule Take 1 capsule by mouth every morning.    pantoprazole (PROTONIX) 40 MG tablet Take 40 mg by mouth daily.    risperiDONE (RISPERDAL) 0.5 MG tablet Take 0.5 mg by mouth daily.    warfarin (COUMADIN) 2 MG tablet Take 2 mg by mouth daily.       Allergies  Allergen Reactions  . Neomycin-Bacitracin Zn-Polymyx Hives  . Benzalkonium Chloride Rash   Follow-up Information    Primary care physicain. Schedule an appointment as soon as possible for a visit in 1 week(s).   Why:  Hospital follow up           The results of significant diagnostics from this hospitalization (including imaging, microbiology, ancillary and laboratory) are listed below for reference.    Significant Diagnostic Studies: Dg Chest 2 View  Result Date: 12/18/2016 CLINICAL DATA:  Altered mental status. Nonresponsive. Abnormal lung sounds on exam. EXAM: CHEST  2 VIEW COMPARISON:  01/09/2016 FINDINGS: The heart size and mediastinal contours are  within normal limits. Aortic atherosclerosis. Low lung volumes noted. Both lungs are clear. IMPRESSION: Low lung volumes.  No active disease. Electronically Signed   By: Myles Rosenthal M.D.   On: 12/18/2016 21:11   Ct Head Wo Contrast  Result Date: 12/18/2016 CLINICAL DATA:  Altered mental status EXAM: CT HEAD WITHOUT CONTRAST TECHNIQUE: Contiguous axial images were obtained from the base of the skull through the vertex without intravenous contrast. COMPARISON:  06/01/2015 FINDINGS: Brain: Motion degraded study. No gross hemorrhage or mass is visualized. Hypodensity within the left thalamus, new since comparison CT from 2017. Additional small hypodensity in the right thalamus. Atrophy with small vessel ischemic changes of the white matter. Vascular: No hyperdense vessels.  Carotid artery calcification. Skull: No fracture or suspicious lesion. Fluid within the right mastoid air cells. Sinuses/Orbits: Mucosal thickening within the frontal and ethmoid sinuses. No acute orbital abnormality Other: None IMPRESSION: 1. Hypodensity within the left thalamus, new since 2017, and suspected to represent acute infarct. Could obtain MRI for confirmation. Smaller hypodensity in the right thalamus fell consistent with a age indeterminate lacunar infarct. No hemorrhage is seen allowing for motion degradation 2. Atrophy with small vessel ischemic changes of the white matter 3. Right mastoid effusion Electronically Signed   By: Jasmine Pang M.D.   On: 12/18/2016 21:06   [name] Wo Contrast  Result Date: 12/19/2016 CLINICAL DATA:  Follow-up infarct. EXAM: MRA HEAD WITHOUT CONTRAST TECHNIQUE: Angiographic images of the Circle of Willis were  obtained using MRA technique without intravenous contrast. COMPARISON:  MRI of the head December 18, 2016 FINDINGS: Moderately motion degraded examination predominately at and above the circle Willis. ANTERIOR CIRCULATION: Flow related enhancement of the included cervical, petrous, cavernous  and supraclinoid internal carotid arteries. Patent anterior communicating artery. Patent proximal anterior and middle cerebral arteries. Probable azygos ACA. No large vessel occlusion or aneurysm. POSTERIOR CIRCULATION: Codominant vertebral artery's. Patent basilar artery. Posterior-inferior cerebellar artery is not well demonstrated. Limited assessment of main branch vessels. High-grade stenosis versus motion artifact posterior cerebral artery's. No large vessel occlusion or aneurysm. ANATOMIC VARIANTS: None. Source images and MIP images were reviewed. IMPRESSION: 1. Limited motion degraded examination. No emergent large vessel occlusion. 2. Severe stenosis versus motion artifact proximal posterior cerebral artery's. Electronically Signed   By: Awilda Metro M.D.   On: 12/19/2016 06:52   Mr Brain Wo Contrast  Result Date: 12/18/2016 CLINICAL DATA:  Altered mental status this morning.  Unresponsive. EXAM: MRI HEAD WITHOUT CONTRAST TECHNIQUE: Multiplanar, multiecho pulse sequences of the brain and surrounding structures were obtained without intravenous contrast. COMPARISON:  CT HEAD December 18, 2016 at 2044 hours FINDINGS: Due to habitus, patient scanned on side with old head coil resulting in suboptimal examination. Moderately motion degraded examination. BRAIN: Bilateral mesial thalamus reduced diffusion measuring to 12 mm on the LEFT, no ADC abnormality. No susceptibility artifact to suggest hemorrhage though motion degrades sensitivity. Ventricles and sulci are normal for patient's age. No midline shift, mass effect or definite masses. Patchy supratentorial white matter FLAIR T2 hyperintensities compatible with mild chronic small vessel ischemic disease, less than expected for age. No abnormal extra-axial fluid collections. VASCULAR: Normal major intracranial vascular flow voids present at skull base. SKULL AND UPPER CERVICAL SPINE: No abnormal sellar expansion. No suspicious calvarial bone marrow signal.  Craniocervical junction maintained. SINUSES/ORBITS: RIGHT mastoid effusion. Paranasal sinuses well aerated. The included ocular globes and orbital contents are non-suspicious. OTHER: None. IMPRESSION: 1. Habitus and motion degraded examination. 2. Subacute bilateral thalamus infarcts. Findings may reflect artery of percheron infarctions, basilar tip thrombosis, or dural venous sinus occlusion. Findings less likely reflect Wernicke's encephalopathy. 3. Given limited MRI examination, if vascular imaging is performed, recommend CT angiogram. Electronically Signed   By: Awilda Metro M.D.   On: 12/18/2016 22:41    Microbiology: Recent Results (from the past 240 hour(s))  MRSA PCR Screening     Status: None   Collection Time: 12/19/16  6:26 AM  Result Value Ref Range Status   MRSA by PCR NEGATIVE NEGATIVE Final    Comment:        The GeneXpert MRSA Assay (FDA approved for NASAL specimens only), is one component of a comprehensive MRSA colonization surveillance program. It is not intended to diagnose MRSA infection nor to guide or monitor treatment for MRSA infections.      Labs: Basic Metabolic Panel:  Recent Labs Lab 12/18/16 2044 12/18/16 2236 12/21/16 0717 12/24/16 0543  NA 141 143 138 140  K 6.3* 4.0 3.9 3.3*  CL 104 106 106 103  CO2  --  GLUCOSE 137* 122* 79 88  BUN 38* 26* 18 14  CREATININE 1.50* 1.44* 1.14 1.02  CALCIUM  --  9.1 8.8* 8.8*   Liver Function Tests:  Recent Labs Lab 12/18/16 2236  AST 20  ALT 24  ALKPHOS 83  BILITOT 0.7  PROT 7.2  ALBUMIN 3.7   No results for input(s): LIPASE, AMYLASE in the last 168 hours. No results  for input(s): AMMONIA in the last 168 hours. CBC:  Recent Labs Lab 12/18/16 2022  12/20/16 0545 12/21/16 0717 12/22/16 0600 12/23/16 0741 12/24/16 0543  WBC 9.1  --  8.7 6.1 7.0 6.5 6.5  NEUTROABS 6.2  --   --   --   --   --   --   HGB 14.6  < > 12.0* 11.5* 11.3* 11.3* 11.8*  HCT 42.1  < > 37.0* 36.3* 35.9*  36.0* 37.2*  MCV 101.0*  --  102.8* 101.1* 102.0* 101.7* 101.1*  PLT 175  --  195 165 167 147* 167  < > = values in this interval not displayed. Cardiac Enzymes: No results for input(s): CKTOTAL, CKMB, CKMBINDEX, TROPONINI in the last 168 hours. BNP: BNP (last 3 results) No results for input(s): BNP in the last 8760 hours.  ProBNP (last 3 results) No results for input(s): PROBNP in the last 8760 hours.  CBG: No results for input(s): GLUCAP in the last 168 hours.     SignedEdsel Petrin  [facility] 12/24/2016, 2:50 PM

## 2016-12-24 NOTE — Progress Notes (Addendum)
ANTICOAGULATION CONSULT NOTE - Follow Up Consult  Pharmacy Consult for warfarin Indication: atrial fibrillation  Allergies  Allergen Reactions  . Neomycin-Bacitracin Zn-Polymyx Hives  . Benzalkonium Chloride Rash    Patient Measurements: Height: 5'11" Weight: 183 lbs  Heparin dosing weight: 78 Kg   Vital Signs: Temp: 98.1 F (36.7 C) (10/07 0900) Temp Source: Axillary (10/07 0900) BP: 148/74 (10/07 0900) Pulse Rate: 64 (10/07 0900)  Labs:  Recent Labs  12/22/16 0600  12/23/16 0741 12/23/16 1404 12/23/16 1942 12/24/16 0543  HGB 11.3*  --  11.3*  --   --  11.8*  HCT 35.9*  --  36.0*  --   --  37.2*  PLT 167  --  147*  --   --  167  LABPROT 23.3*  --   --  23.1*  --  23.4*  INR 2.09  --   --  2.07  --  2.10  HEPARINUNFRC 0.50  < > 0.25*  --  0.45 0.33  CREATININE  --   --   --   --   --  1.02  < > = values in this interval not displayed.  Assessment: 83 yom with history of dementia with Parkinson's disease who was found to have a bilateral thalamic stroke. He has a history of atrial fibrillation on warfarin at home.   Patient able to take crushed po meds. Received one dose of warfarin last night. INR this AM was 2.10. Heparin discontinued. CBC stable, no bleeding reported. On po levothyroxine and amiodarone.   PTA warfarin 2 mg daily (last dose 9/30, INR 2.17 on admit). On amiodarone and levothyroxine at home on this dose.   Goal of Therapy:  INR 2-3  Monitor platelets by anticoagulation protocol: Yes   Plan:  Warfarin 2 mg po x 1 tonight Daily INR and CBC Monitor for s/s bleeding  Adline Potter, PharmD Pharmacy Resident Pager: [phone]

## 2016-12-24 NOTE — Clinical Social Work Note (Addendum)
CSW notified pt medically stable and ready to go back to his facility. CSW contacted facility [facility] [phone] and spoke to [facility] who reported pt is not from this facility.  CSW will contact family for further information.  Kirti Carl B. Janell Quiet, LCSWA Clinical Social Work Dept Weekend Social Worker [phone] 2:39 PM    CSW received call  from [facility] [phone], pt is from this facility, ok for pt to return, pt will go back to [address], nurse please call report to [phone] [address]. MD notified.  Curry Seefeldt B. Gean Quint Clinical Social Work Dept Weekend Social Worker [phone] 2:56 PM

## 2016-12-24 NOTE — Clinical Social Work Note (Signed)
Clinical Social Worker facilitated patient discharge including contacting patient family and facility to confirm patient discharge plans.  Clinical information faxed to facility and family agreeable with plan. CSW arranged ambulance transport via [facility] to [facility].  RN to call report to [phone] prior to discharge.  Clinical Social Worker will sign off for now as social work intervention is no longer needed. Please consult [place] again if new need arises.  Synethia Endicott B. Gean Quint Clinical Social Work Dept Weekend Social Worker [phone] 3:07 PM

## 2016-12-24 NOTE — Progress Notes (Signed)
Call report given to nurse at [facility].

## 2017-01-18 DEATH — deceased

## 2018-09-21 IMAGING — MR MR MRA HEAD W/O CM
1 series · 19 of 48 positions shown · non-contrast
Comparison: MRI of the head December 18, 2016

CLINICAL DATA: Follow-up infarct.

EXAM:
MRA HEAD WITHOUT CONTRAST
TECHNIQUE: Angiographic images of the Circle of Willis were obtained using MRA
technique without intravenous contrast.

[Series 4: ax (id) 2 · axial · 1.0mm · 0.43mm/px · z∈[-124,-43]mm · 19 of 184 slices shown]
[im 1/184]
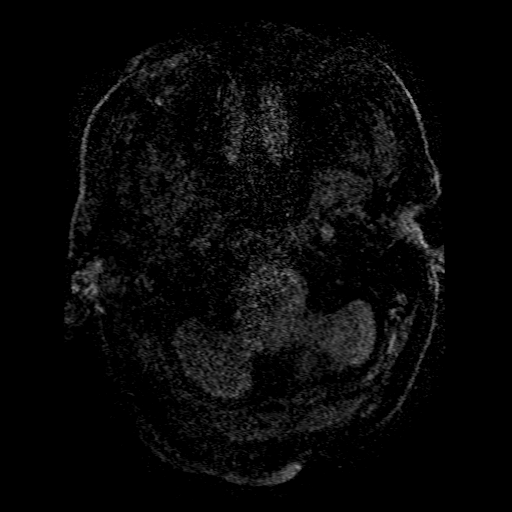
[im 4/184]
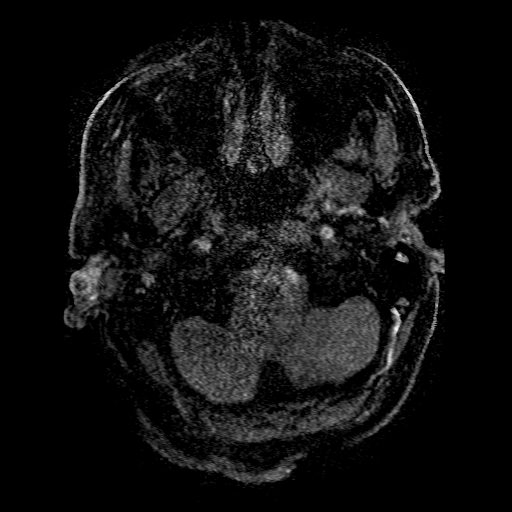
[im 8/184]
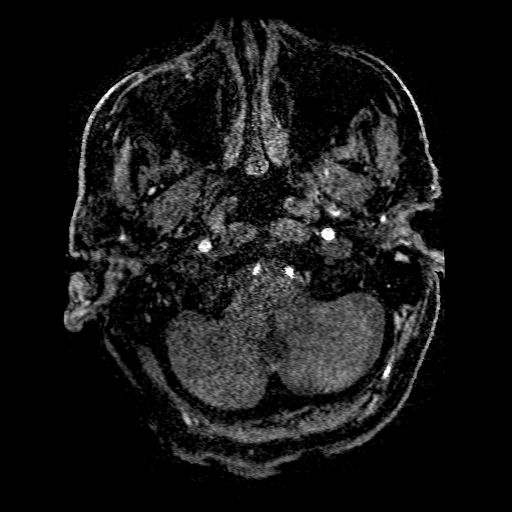
[im 12/184]
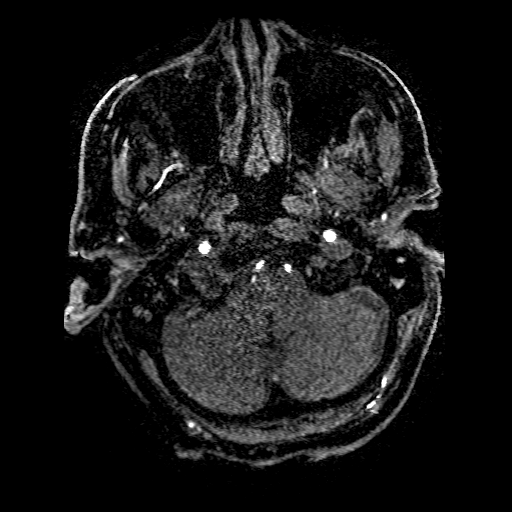
[im 16/184]
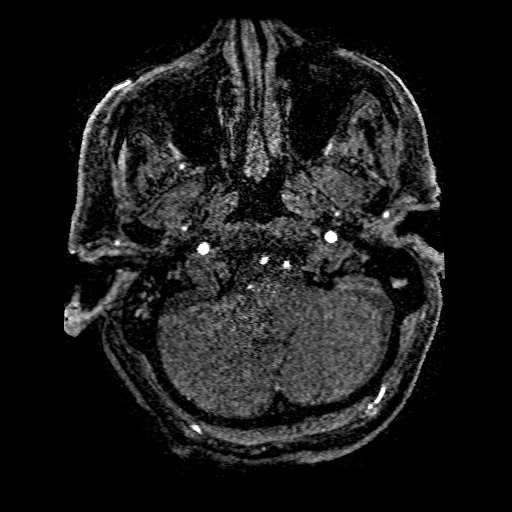
[im 20/184]
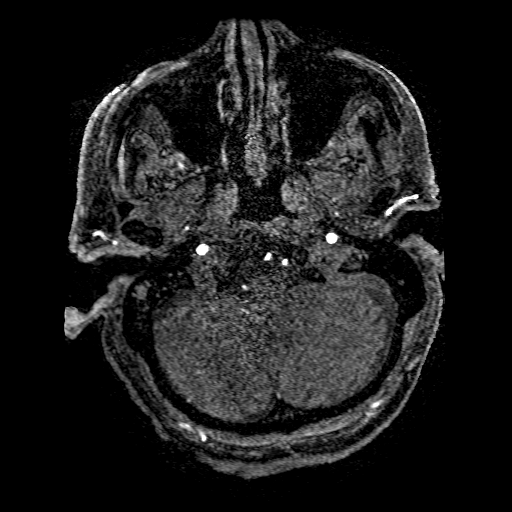
[im 24/184]
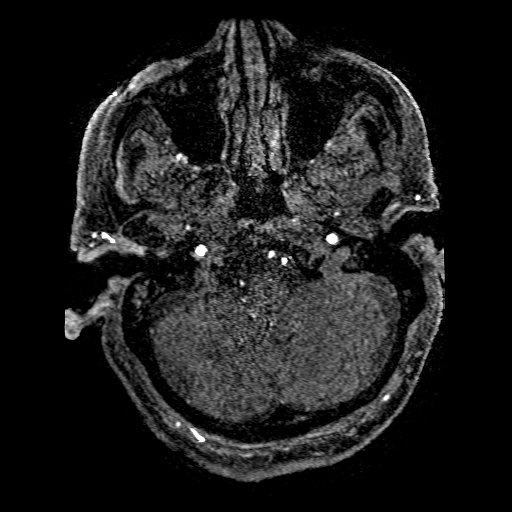
[im 28/184]
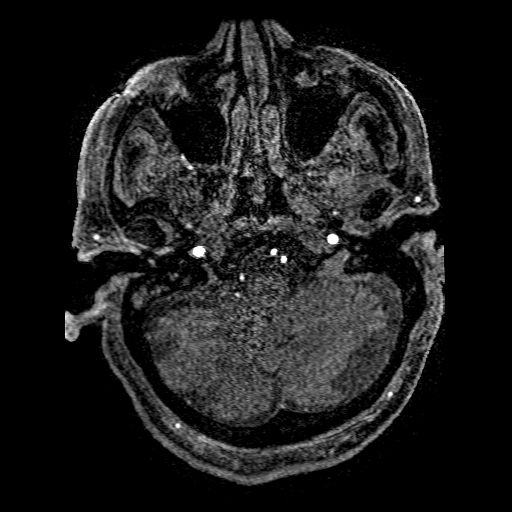
[im 32/184]
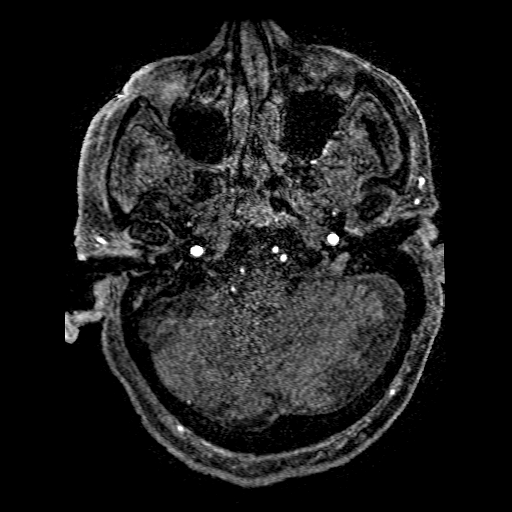
[im 36/184]
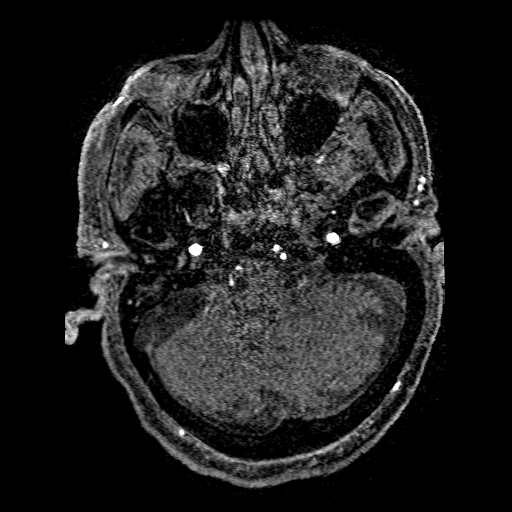
[im 39/184]
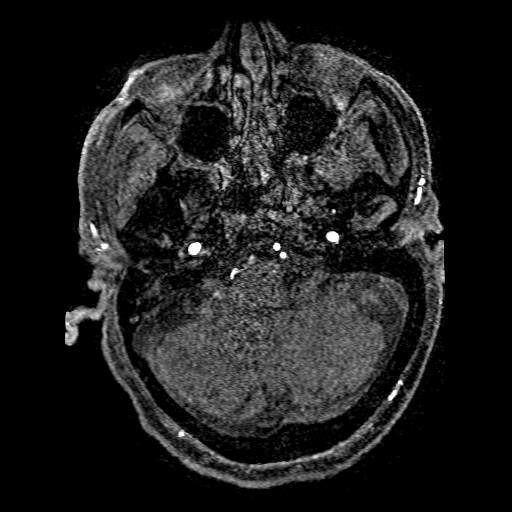
[im 59/184]
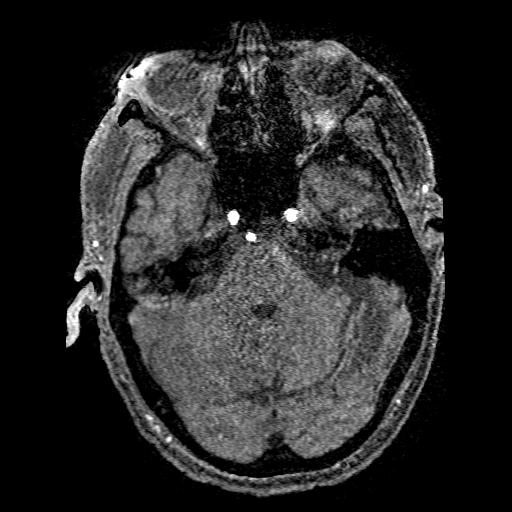
[im 82/184]
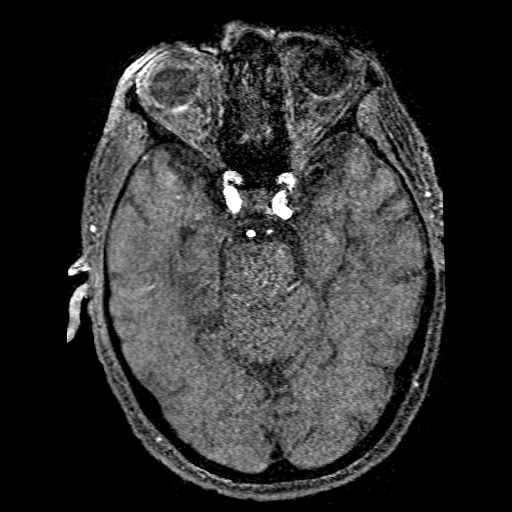
[im 94/184]
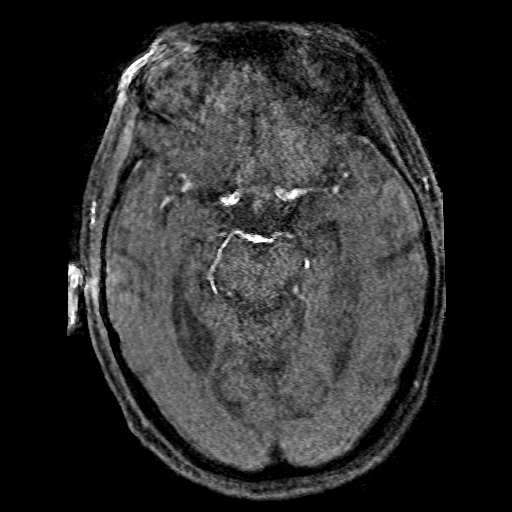
[im 106/184]
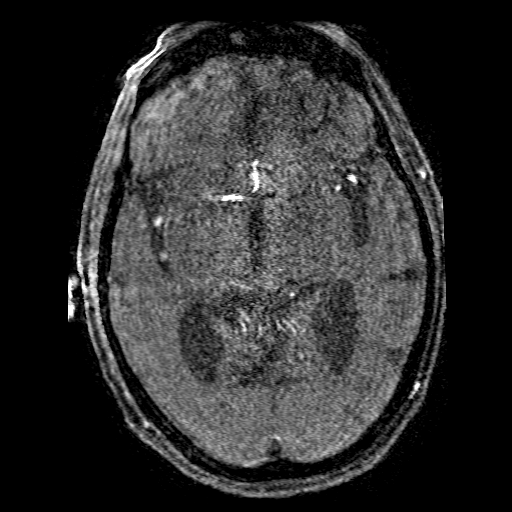
[im 129/184]
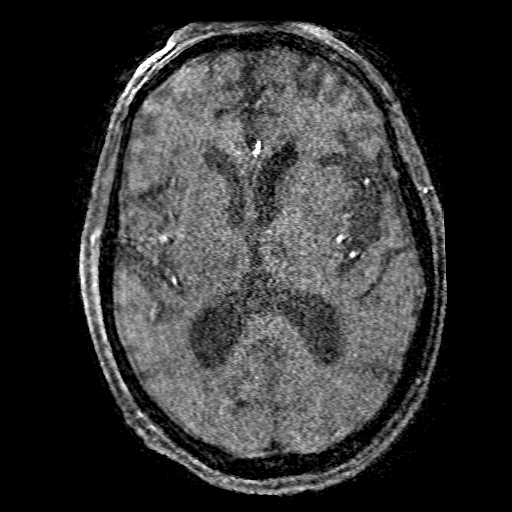
[im 152/184]
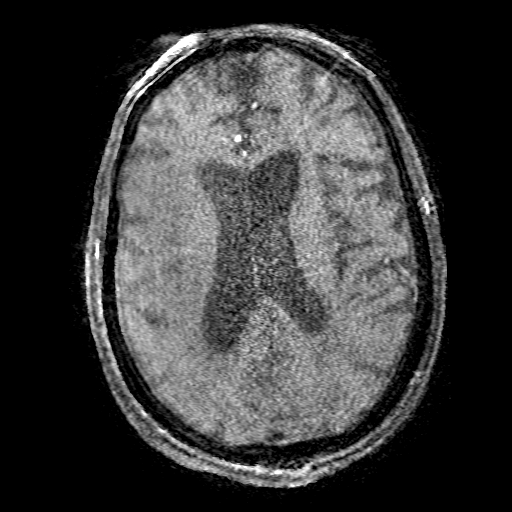
[im 156/184]
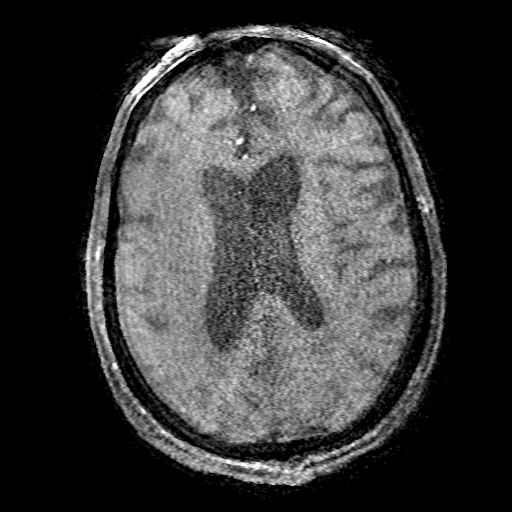
[im 176/184]
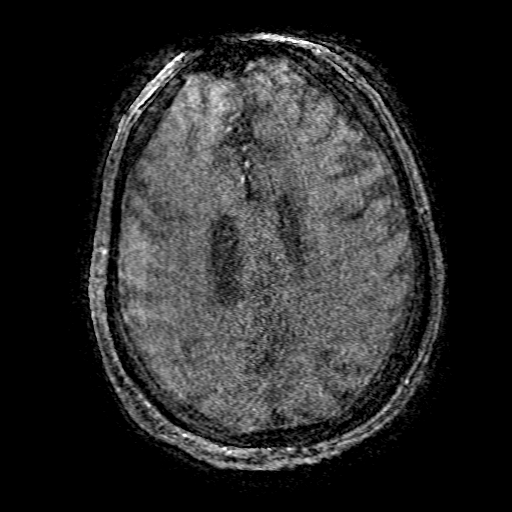

[19 of 48 positions shown; findings below may reference images not displayed]

FINDINGS: Moderately motion degraded examination predominately at and above
the circle Willis.

ANTERIOR CIRCULATION: Flow related enhancement of the included
cervical, petrous, cavernous and supraclinoid internal carotid
arteries. Patent anterior communicating artery. Patent proximal
anterior and middle cerebral arteries. Probable azygos ACA.

No large vessel occlusion or aneurysm.

POSTERIOR CIRCULATION: Codominant vertebral artery's. Patent basilar
artery. Posterior-inferior cerebellar artery is not well
demonstrated. Limited assessment of main branch vessels. High-grade
stenosis versus motion artifact posterior cerebral artery's.

No large vessel occlusion or aneurysm.

ANATOMIC VARIANTS: None.

Source images and MIP images were reviewed.
IMPRESSION: 1. Limited motion degraded examination. No emergent large vessel
occlusion.
2. Severe stenosis versus motion artifact proximal posterior
cerebral artery's.
